# Patient Record
Sex: Female | Born: 1935
Health system: Southern US, Community
[De-identification: ages and names within clinical notes are randomized; demographics above are authoritative.]

## PROBLEM LIST (undated history)

## (undated) DIAGNOSIS — I10 Essential (primary) hypertension: Secondary | ICD-10-CM

## (undated) DIAGNOSIS — E7800 Pure hypercholesterolemia, unspecified: Secondary | ICD-10-CM

---

## 2017-06-05 ENCOUNTER — Other Ambulatory Visit: Payer: Self-pay

## 2017-06-05 ENCOUNTER — Encounter (HOSPITAL_COMMUNITY): Payer: Self-pay | Admitting: Emergency Medicine

## 2017-06-05 ENCOUNTER — Inpatient Hospital Stay (HOSPITAL_COMMUNITY)
Admission: EM | Admit: 2017-06-05 | Discharge: 2017-06-10 | DRG: 871 | Disposition: A | Discharge status: Hospice | Payer: Medicare HMO | Attending: Internal Medicine | Admitting: Internal Medicine

## 2017-06-05 ENCOUNTER — Emergency Department (HOSPITAL_COMMUNITY): Payer: Medicare HMO

## 2017-06-05 DIAGNOSIS — R6251 Failure to thrive (child): Secondary | ICD-10-CM

## 2017-06-05 DIAGNOSIS — G20 Parkinson's disease: Secondary | ICD-10-CM | POA: Diagnosis not present

## 2017-06-05 DIAGNOSIS — Z515 Encounter for palliative care: Secondary | ICD-10-CM | POA: Diagnosis not present

## 2017-06-05 DIAGNOSIS — J181 Lobar pneumonia, unspecified organism: Secondary | ICD-10-CM | POA: Diagnosis present

## 2017-06-05 DIAGNOSIS — H9190 Unspecified hearing loss, unspecified ear: Secondary | ICD-10-CM | POA: Diagnosis not present

## 2017-06-05 DIAGNOSIS — R0902 Hypoxemia: Secondary | ICD-10-CM | POA: Diagnosis not present

## 2017-06-05 DIAGNOSIS — Z6821 Body mass index (BMI) 21.0-21.9, adult: Secondary | ICD-10-CM

## 2017-06-05 DIAGNOSIS — A408 Other streptococcal sepsis: Secondary | ICD-10-CM | POA: Diagnosis not present

## 2017-06-05 DIAGNOSIS — R269 Unspecified abnormalities of gait and mobility: Secondary | ICD-10-CM | POA: Diagnosis present

## 2017-06-05 DIAGNOSIS — Z7189 Other specified counseling: Secondary | ICD-10-CM

## 2017-06-05 DIAGNOSIS — E785 Hyperlipidemia, unspecified: Secondary | ICD-10-CM | POA: Diagnosis not present

## 2017-06-05 DIAGNOSIS — I361 Nonrheumatic tricuspid (valve) insufficiency: Secondary | ICD-10-CM | POA: Diagnosis not present

## 2017-06-05 DIAGNOSIS — Z7984 Long term (current) use of oral hypoglycemic drugs: Secondary | ICD-10-CM | POA: Diagnosis not present

## 2017-06-05 DIAGNOSIS — I330 Acute and subacute infective endocarditis: Secondary | ICD-10-CM | POA: Diagnosis present

## 2017-06-05 DIAGNOSIS — Z9181 History of falling: Secondary | ICD-10-CM

## 2017-06-05 DIAGNOSIS — I482 Chronic atrial fibrillation: Secondary | ICD-10-CM | POA: Diagnosis present

## 2017-06-05 DIAGNOSIS — G9341 Metabolic encephalopathy: Secondary | ICD-10-CM | POA: Diagnosis not present

## 2017-06-05 DIAGNOSIS — E7800 Pure hypercholesterolemia, unspecified: Secondary | ICD-10-CM | POA: Diagnosis present

## 2017-06-05 DIAGNOSIS — J189 Pneumonia, unspecified organism: Secondary | ICD-10-CM

## 2017-06-05 DIAGNOSIS — Y831 Surgical operation with implant of artificial internal device as the cause of abnormal reaction of the patient, or of later complication, without mention of misadventure at the time of the procedure: Secondary | ICD-10-CM | POA: Diagnosis present

## 2017-06-05 DIAGNOSIS — Z781 Physical restraint status: Secondary | ICD-10-CM

## 2017-06-05 DIAGNOSIS — T826XXA Infection and inflammatory reaction due to cardiac valve prosthesis, initial encounter: Secondary | ICD-10-CM | POA: Diagnosis not present

## 2017-06-05 DIAGNOSIS — R7881 Bacteremia: Secondary | ICD-10-CM | POA: Diagnosis not present

## 2017-06-05 DIAGNOSIS — A419 Sepsis, unspecified organism: Secondary | ICD-10-CM

## 2017-06-05 DIAGNOSIS — Z66 Do not resuscitate: Secondary | ICD-10-CM | POA: Diagnosis not present

## 2017-06-05 DIAGNOSIS — F0151 Vascular dementia with behavioral disturbance: Secondary | ICD-10-CM | POA: Diagnosis present

## 2017-06-05 DIAGNOSIS — Z7901 Long term (current) use of anticoagulants: Secondary | ICD-10-CM | POA: Diagnosis not present

## 2017-06-05 DIAGNOSIS — R4182 Altered mental status, unspecified: Secondary | ICD-10-CM | POA: Diagnosis not present

## 2017-06-05 DIAGNOSIS — M21371 Foot drop, right foot: Secondary | ICD-10-CM

## 2017-06-05 DIAGNOSIS — F05 Delirium due to known physiological condition: Secondary | ICD-10-CM | POA: Diagnosis present

## 2017-06-05 DIAGNOSIS — Z974 Presence of external hearing-aid: Secondary | ICD-10-CM

## 2017-06-05 DIAGNOSIS — B955 Unspecified streptococcus as the cause of diseases classified elsewhere: Secondary | ICD-10-CM | POA: Diagnosis not present

## 2017-06-05 DIAGNOSIS — F02818 Dementia in other diseases classified elsewhere, unspecified severity, with other behavioral disturbance: Secondary | ICD-10-CM

## 2017-06-05 DIAGNOSIS — R627 Adult failure to thrive: Secondary | ICD-10-CM | POA: Diagnosis present

## 2017-06-05 DIAGNOSIS — I2690 Septic pulmonary embolism without acute cor pulmonale: Secondary | ICD-10-CM | POA: Diagnosis not present

## 2017-06-05 DIAGNOSIS — I10 Essential (primary) hypertension: Secondary | ICD-10-CM | POA: Diagnosis not present

## 2017-06-05 DIAGNOSIS — F0281 Dementia in other diseases classified elsewhere with behavioral disturbance: Secondary | ICD-10-CM

## 2017-06-05 DIAGNOSIS — I38 Endocarditis, valve unspecified: Secondary | ICD-10-CM

## 2017-06-05 DIAGNOSIS — I76 Septic arterial embolism: Secondary | ICD-10-CM | POA: Diagnosis present

## 2017-06-05 DIAGNOSIS — R262 Difficulty in walking, not elsewhere classified: Secondary | ICD-10-CM | POA: Diagnosis not present

## 2017-06-05 DIAGNOSIS — B370 Candidal stomatitis: Secondary | ICD-10-CM

## 2017-06-05 DIAGNOSIS — A409 Streptococcal sepsis, unspecified: Secondary | ICD-10-CM | POA: Diagnosis not present

## 2017-06-05 HISTORY — DX: Pure hypercholesterolemia, unspecified: E78.00

## 2017-06-05 HISTORY — DX: Essential (primary) hypertension: I10

## 2017-06-05 LAB — URINALYSIS, ROUTINE W REFLEX MICROSCOPIC
Bilirubin Urine: NEGATIVE
GLUCOSE, UA: NEGATIVE mg/dL
Ketones, ur: 20 mg/dL — AB
Leukocytes, UA: NEGATIVE
Nitrite: NEGATIVE
PROTEIN: 30 mg/dL — AB
SPECIFIC GRAVITY, URINE: 1.013 (ref 1.005–1.030)
pH: 7 (ref 5.0–8.0)

## 2017-06-05 LAB — BLOOD CULTURE ID PANEL (REFLEXED)
ACINETOBACTER BAUMANNII: NOT DETECTED
CANDIDA ALBICANS: NOT DETECTED
CANDIDA TROPICALIS: NOT DETECTED
Candida glabrata: NOT DETECTED
Candida krusei: NOT DETECTED
Candida parapsilosis: NOT DETECTED
ENTEROBACTERIACEAE SPECIES: NOT DETECTED
ENTEROCOCCUS SPECIES: NOT DETECTED
Enterobacter cloacae complex: NOT DETECTED
Escherichia coli: NOT DETECTED
HAEMOPHILUS INFLUENZAE: NOT DETECTED
KLEBSIELLA PNEUMONIAE: NOT DETECTED
Klebsiella oxytoca: NOT DETECTED
LISTERIA MONOCYTOGENES: NOT DETECTED
NEISSERIA MENINGITIDIS: NOT DETECTED
Proteus species: NOT DETECTED
Pseudomonas aeruginosa: NOT DETECTED
STAPHYLOCOCCUS AUREUS BCID: NOT DETECTED
STREPTOCOCCUS AGALACTIAE: NOT DETECTED
STREPTOCOCCUS SPECIES: DETECTED — AB
Serratia marcescens: NOT DETECTED
Staphylococcus species: NOT DETECTED
Streptococcus pneumoniae: NOT DETECTED
Streptococcus pyogenes: NOT DETECTED

## 2017-06-05 LAB — RESPIRATORY PANEL BY PCR
Adenovirus: NOT DETECTED
Bordetella pertussis: NOT DETECTED
CORONAVIRUS OC43-RVPPCR: NOT DETECTED
Chlamydophila pneumoniae: NOT DETECTED
Coronavirus 229E: NOT DETECTED
Coronavirus HKU1: NOT DETECTED
Coronavirus NL63: NOT DETECTED
INFLUENZA A-RVPPCR: NOT DETECTED
INFLUENZA B-RVPPCR: NOT DETECTED
METAPNEUMOVIRUS-RVPPCR: NOT DETECTED
Mycoplasma pneumoniae: NOT DETECTED
PARAINFLUENZA VIRUS 1-RVPPCR: NOT DETECTED
PARAINFLUENZA VIRUS 2-RVPPCR: NOT DETECTED
PARAINFLUENZA VIRUS 4-RVPPCR: NOT DETECTED
Parainfluenza Virus 3: NOT DETECTED
RESPIRATORY SYNCYTIAL VIRUS-RVPPCR: NOT DETECTED
Rhinovirus / Enterovirus: NOT DETECTED

## 2017-06-05 LAB — COMPREHENSIVE METABOLIC PANEL
ALBUMIN: 3.5 g/dL (ref 3.5–5.0)
ALT: 27 U/L (ref 14–54)
ANION GAP: 12 (ref 5–15)
AST: 37 U/L (ref 15–41)
Alkaline Phosphatase: 92 U/L (ref 38–126)
BUN: 14 mg/dL (ref 6–20)
CO2: 24 mmol/L (ref 22–32)
Calcium: 8.5 mg/dL — ABNORMAL LOW (ref 8.9–10.3)
Chloride: 96 mmol/L — ABNORMAL LOW (ref 101–111)
Creatinine, Ser: 0.9 mg/dL (ref 0.44–1.00)
GFR calc Af Amer: >60 mL/min (ref >60)
GFR calc non Af Amer: 53 mL/min — ABNORMAL LOW (ref >60)
GLUCOSE: 139 mg/dL — AB (ref 65–99)
POTASSIUM: 3.4 mmol/L — AB (ref 3.5–5.1)
SODIUM: 132 mmol/L — AB (ref 135–145)
Total Bilirubin: 1.8 mg/dL — ABNORMAL HIGH (ref 0.3–1.2)
Total Protein: 6.8 g/dL (ref 6.5–8.1)

## 2017-06-05 LAB — CBC WITH DIFFERENTIAL/PLATELET
Basophils Absolute: 0 10*3/uL (ref 0.0–0.1)
Basophils Relative: 0 %
EOS PCT: 0 %
Eosinophils Absolute: 0 10*3/uL (ref 0.0–0.7)
HEMATOCRIT: 39.8 % (ref 36.0–46.0)
Hemoglobin: 13.4 g/dL (ref 12.0–15.0)
LYMPHS ABS: 0.7 10*3/uL (ref 0.7–4.0)
LYMPHS PCT: 4 %
MCH: 31.7 pg (ref 26.0–34.0)
MCHC: 33.7 g/dL (ref 30.0–36.0)
MCV: 94.1 fL (ref 78.0–100.0)
MONO ABS: 1.5 10*3/uL — AB (ref 0.1–1.0)
MONOS PCT: 8 %
NEUTROS ABS: 15.4 10*3/uL — AB (ref 1.7–7.7)
Neutrophils Relative %: 88 %
Platelets: 158 10*3/uL (ref 150–400)
RBC: 4.23 MIL/uL (ref 3.87–5.11)
RDW: 13.6 % (ref 11.5–15.5)
WBC: 17.7 10*3/uL — ABNORMAL HIGH (ref 4.0–10.5)

## 2017-06-05 LAB — GLUCOSE, CAPILLARY
GLUCOSE-CAPILLARY: 155 mg/dL — AB (ref 65–99)
Glucose-Capillary: 121 mg/dL — ABNORMAL HIGH (ref 65–99)
Glucose-Capillary: 155 mg/dL — ABNORMAL HIGH (ref 65–99)

## 2017-06-05 LAB — CG4 I-STAT (LACTIC ACID): Lactic Acid, Venous: 1.89 mmol/L (ref 0.5–1.9)

## 2017-06-05 LAB — HEMOGLOBIN A1C
Hgb A1c MFr Bld: 6.2 % — ABNORMAL HIGH (ref 4.8–5.6)
Mean Plasma Glucose: 131.24 mg/dL

## 2017-06-05 LAB — INFLUENZA PANEL BY PCR (TYPE A & B)
Influenza A By PCR: NEGATIVE
Influenza B By PCR: NEGATIVE

## 2017-06-05 LAB — TROPONIN I: TROPONIN I: 0.5 ng/mL — AB (ref <0.03)

## 2017-06-05 LAB — STREP PNEUMONIAE URINARY ANTIGEN: Strep Pneumo Urinary Antigen: NEGATIVE

## 2017-06-05 MED ORDER — PIPERACILLIN-TAZOBACTAM 3.375 G IVPB
3.3750 g | Freq: Three times a day (TID) | INTRAVENOUS | Status: DC
  Administered 2017-06-05: 3.375 g via INTRAVENOUS
  Filled 2017-06-05 (×2): qty 50

## 2017-06-05 MED ORDER — HYDRALAZINE HCL 20 MG/ML IJ SOLN
5.0000 mg | INTRAMUSCULAR | Status: DC | PRN
Start: 2017-06-05 — End: 2017-06-08

## 2017-06-05 MED ORDER — BRIMONIDINE TARTRATE-TIMOLOL 0.2-0.5 % OP SOLN: 1.0000 [drp] | Freq: Two times a day (BID) | OPHTHALMIC | Status: DC

## 2017-06-05 MED ORDER — LATANOPROST 0.005 % OP SOLN
1.0000 [drp] | Freq: Every day | OPHTHALMIC | Status: DC
  Administered 2017-06-05 – 2017-06-10 (×6): 1 [drp] via OPHTHALMIC
  Filled 2017-06-05: qty 2.5

## 2017-06-05 MED ORDER — IPRATROPIUM-ALBUTEROL 0.5-2.5 (3) MG/3ML IN SOLN
3.0000 mL | Freq: Four times a day (QID) | RESPIRATORY_TRACT | Status: DC
  Administered 2017-06-05: 3 mL via RESPIRATORY_TRACT
  Filled 2017-06-05: qty 3

## 2017-06-05 MED ORDER — IPRATROPIUM-ALBUTEROL 0.5-2.5 (3) MG/3ML IN SOLN
3.0000 mL | Freq: Four times a day (QID) | RESPIRATORY_TRACT | Status: DC
  Filled 2017-06-05: qty 3

## 2017-06-05 MED ORDER — TIMOLOL MALEATE 0.5 % OP SOLN
1.0000 [drp] | Freq: Two times a day (BID) | OPHTHALMIC | Status: DC
  Administered 2017-06-05 – 2017-06-10 (×12): 1 [drp] via OPHTHALMIC
  Filled 2017-06-05: qty 5

## 2017-06-05 MED ORDER — ACETAMINOPHEN 650 MG RE SUPP: 650.0000 mg | Freq: Four times a day (QID) | RECTAL | Status: DC | PRN

## 2017-06-05 MED ORDER — DEXTROSE 5 % IV SOLN
500.0000 mg | INTRAVENOUS | Status: DC
  Administered 2017-06-05 – 2017-06-09 (×5): 500 mg via INTRAVENOUS
  Filled 2017-06-05 (×5): qty 500

## 2017-06-05 MED ORDER — ATORVASTATIN CALCIUM 40 MG PO TABS
40.0000 mg | ORAL_TABLET | Freq: Every day | ORAL | Status: DC
  Administered 2017-06-07 – 2017-06-08 (×2): 40 mg via ORAL
  Filled 2017-06-05 (×3): qty 1

## 2017-06-05 MED ORDER — METRONIDAZOLE IN NACL 5-0.79 MG/ML-% IV SOLN
500.0000 mg | Freq: Three times a day (TID) | INTRAVENOUS | Status: DC
  Administered 2017-06-05 – 2017-06-07 (×6): 500 mg via INTRAVENOUS
  Filled 2017-06-05 (×8): qty 100

## 2017-06-05 MED ORDER — BISACODYL 5 MG PO TBEC
5.0000 mg | DELAYED_RELEASE_TABLET | Freq: Every day | ORAL | Status: DC | PRN
  Administered 2017-06-07: 5 mg via ORAL
  Filled 2017-06-05: qty 1

## 2017-06-05 MED ORDER — VANCOMYCIN HCL IN DEXTROSE 1-5 GM/200ML-% IV SOLN
1000.0000 mg | Freq: Once | INTRAVENOUS | Status: AC
  Administered 2017-06-05: 1000 mg via INTRAVENOUS
  Filled 2017-06-05: qty 200

## 2017-06-05 MED ORDER — HYDROCODONE-ACETAMINOPHEN 5-325 MG PO TABS: 1.0000 | ORAL_TABLET | ORAL | Status: DC | PRN

## 2017-06-05 MED ORDER — DIGOXIN 125 MCG PO TABS
0.1250 mg | ORAL_TABLET | Freq: Every day | ORAL | Status: DC
  Administered 2017-06-05 – 2017-06-10 (×6): 0.125 mg via ORAL
  Filled 2017-06-05 (×6): qty 1

## 2017-06-05 MED ORDER — IPRATROPIUM-ALBUTEROL 0.5-2.5 (3) MG/3ML IN SOLN: 3.0000 mL | RESPIRATORY_TRACT | Status: DC | PRN

## 2017-06-05 MED ORDER — HALOPERIDOL LACTATE 5 MG/ML IJ SOLN
1.0000 mg | Freq: Four times a day (QID) | INTRAMUSCULAR | Status: DC | PRN
  Administered 2017-06-07: 1 mg via INTRAVENOUS
  Filled 2017-06-05 (×2): qty 1

## 2017-06-05 MED ORDER — ALBUTEROL SULFATE (2.5 MG/3ML) 0.083% IN NEBU: 2.5000 mg | INHALATION_SOLUTION | RESPIRATORY_TRACT | Status: DC | PRN

## 2017-06-05 MED ORDER — VANCOMYCIN HCL IN DEXTROSE 1-5 GM/200ML-% IV SOLN: 1000.0000 mg | INTRAVENOUS | Status: DC

## 2017-06-05 MED ORDER — ACETAMINOPHEN 325 MG PO TABS: 650.0000 mg | ORAL_TABLET | Freq: Four times a day (QID) | ORAL | Status: DC | PRN

## 2017-06-05 MED ORDER — INSULIN ASPART 100 UNIT/ML ~~LOC~~ SOLN
0.0000 [IU] | Freq: Three times a day (TID) | SUBCUTANEOUS | Status: DC
Start: 2017-06-05 — End: 2017-06-10
  Administered 2017-06-05: 1 [IU] via SUBCUTANEOUS
  Administered 2017-06-05: 2 [IU] via SUBCUTANEOUS
  Administered 2017-06-08 (×2): 1 [IU] via SUBCUTANEOUS
  Administered 2017-06-09: 2 [IU] via SUBCUTANEOUS
  Administered 2017-06-09 – 2017-06-10 (×2): 1 [IU] via SUBCUTANEOUS

## 2017-06-05 MED ORDER — DOCUSATE SODIUM 100 MG PO CAPS
100.0000 mg | ORAL_CAPSULE | Freq: Two times a day (BID) | ORAL | Status: DC
  Administered 2017-06-06 – 2017-06-09 (×5): 100 mg via ORAL
  Filled 2017-06-05 (×8): qty 1

## 2017-06-05 MED ORDER — PIPERACILLIN-TAZOBACTAM 3.375 G IVPB
3.3750 g | Freq: Once | INTRAVENOUS | Status: AC
  Administered 2017-06-05: 3.375 g via INTRAVENOUS
  Filled 2017-06-05: qty 50

## 2017-06-05 MED ORDER — ORAL CARE MOUTH RINSE
15.0000 mL | Freq: Two times a day (BID) | OROMUCOSAL | Status: DC
  Administered 2017-06-06 – 2017-06-10 (×6): 15 mL via OROMUCOSAL

## 2017-06-05 MED ORDER — SODIUM CHLORIDE 0.9 % IV BOLUS (SEPSIS)
1000.0000 mL | Freq: Once | INTRAVENOUS | Status: AC
  Administered 2017-06-05: 1000 mL via INTRAVENOUS

## 2017-06-05 MED ORDER — BRIMONIDINE TARTRATE 0.2 % OP SOLN
1.0000 [drp] | Freq: Two times a day (BID) | OPHTHALMIC | Status: DC
  Administered 2017-06-05 – 2017-06-10 (×12): 1 [drp] via OPHTHALMIC
  Filled 2017-06-05: qty 5

## 2017-06-05 MED ORDER — APIXABAN 2.5 MG PO TABS
2.5000 mg | ORAL_TABLET | Freq: Two times a day (BID) | ORAL | Status: DC
  Administered 2017-06-05 – 2017-06-10 (×11): 2.5 mg via ORAL
  Filled 2017-06-05 (×11): qty 1

## 2017-06-05 MED ORDER — POTASSIUM CHLORIDE CRYS ER 20 MEQ PO TBCR
20.0000 meq | EXTENDED_RELEASE_TABLET | Freq: Once | ORAL | Status: AC
  Administered 2017-06-05: 20 meq via ORAL
  Filled 2017-06-05: qty 1

## 2017-06-05 MED ORDER — LISINOPRIL 20 MG PO TABS
20.0000 mg | ORAL_TABLET | Freq: Every day | ORAL | Status: DC
  Administered 2017-06-05 – 2017-06-10 (×6): 20 mg via ORAL
  Filled 2017-06-05 (×6): qty 1

## 2017-06-05 MED ORDER — ONDANSETRON HCL 4 MG/2ML IJ SOLN: 4.0000 mg | Freq: Three times a day (TID) | INTRAMUSCULAR | Status: DC | PRN

## 2017-06-05 MED ORDER — DEXTROSE 5 % IV SOLN
1.0000 g | INTRAVENOUS | Status: DC
  Administered 2017-06-05: 1 g via INTRAVENOUS
  Filled 2017-06-05: qty 10

## 2017-06-05 MED ORDER — SODIUM CHLORIDE 0.9 % IV SOLN
INTRAVENOUS | Status: DC
  Administered 2017-06-05: 50 mL/h via INTRAVENOUS
  Administered 2017-06-08: 21:00:00 via INTRAVENOUS

## 2017-06-05 MED ORDER — METFORMIN HCL ER 500 MG PO TB24
500.0000 mg | ORAL_TABLET | Freq: Every day | ORAL | Status: DC
  Filled 2017-06-05: qty 1

## 2017-06-05 MED ORDER — FUROSEMIDE 10 MG/ML IJ SOLN
40.0000 mg | Freq: Once | INTRAMUSCULAR | Status: AC
  Administered 2017-06-05: 40 mg via INTRAVENOUS
  Filled 2017-06-05: qty 4

## 2017-06-05 MED ORDER — SODIUM CHLORIDE 0.9 % IV SOLN
INTRAVENOUS | Status: DC
Start: 2017-06-05 — End: 2017-06-05
  Administered 2017-06-05: 06:00:00 via INTRAVENOUS

## 2017-06-05 NOTE — H&P (Signed)
History and Physical    Samantha Silva YQM:[id number] DOB: 07/05/1922 DOA: 06/05/2017  PCP: Patient, No Pcp Per Patient coming from: Home   Chief Complaint: confusion and weakness  HPI: Samantha Silva is a 83 y.o. female with medical history significant of open heart surgery, atrial fibrillation, hypertension and hypercholesteremia. Pt recently moved to [place] from [place] to live with Family here.  Pt has had increasing dementia and requires a lot of care from family.  Pt has recently developed increased confusion since the move.  Her daughter inlaw has been sleeping in the room with her.  She reports increased confusion,  Decreased ability to walk.  Pt has developed a cough and had increased shortness of breath during the night     ED Course: EMS found pt to have a temp of 103 at home.  Her 02 saturation was 87%.  Pt placed on 02 and given antibiotics  Review of Systems  Constitutional: Positive for chills and fever.  HENT: Positive for congestion and hearing loss.   Eyes: Negative for blurred vision.  Respiratory: Positive for cough.   Cardiovascular: Negative for chest pain and palpitations.  Gastrointestinal: Negative for abdominal pain.  Genitourinary: Negative for dysuria.  Musculoskeletal: Negative for myalgias.  Skin: Negative for itching and rash.  Neurological: Positive for weakness. Negative for dizziness and focal weakness.  Endo/Heme/Allergies: Does not bruise/bleed easily.  Psychiatric/Behavioral: Negative for depression.  All other systems reviewed and are negative.   Ambulatory Status: ambulatory with a walker  Past Medical History:  Diagnosis Date  . High cholesterol   . Hypertension     History reviewed. No pertinent surgical history.  Social History   Socioeconomic History  . Marital status: Widowed    Spouse name: Not on file  . Number of children: Not on file  . Years of education: Not on file  . Highest education level: Not on file  Social  Needs  . Financial resource strain: Not on file  . Food insecurity - worry: Not on file  . Food insecurity - inability: Not on file  . Transportation needs - medical: Not on file  . Transportation needs - non-medical: Not on file  Occupational History  . Not on file  Tobacco Use  . Smoking status: Never Smoker  . Smokeless tobacco: Never Used  Substance and Sexual Activity  . Alcohol use: Not on file  . Drug use: Not on file  . Sexual activity: Not on file  Other Topics Concern  . Not on file  Social History Narrative  . Not on file    No Known Allergies  History reviewed. No pertinent family history.  Prior to Admission medications   Medication Sig Start Date End Date Taking? Authorizing Provider  apixaban (ELIQUIS) 2.5 MG TABS tablet Take 2.5 mg by mouth 2 (two) times daily.   Yes [provider]  atorvastatin (LIPITOR) 40 MG tablet Take 40 mg by mouth daily.   Yes [provider]  bisacodyl (DULCOLAX) 5 MG EC tablet Take 5 mg by mouth daily as needed for moderate constipation.   Yes [provider]  brimonidine-timolol (COMBIGAN) 0.2-0.5 % ophthalmic solution Place 1 drop into both eyes every 12 (twelve) hours.   Yes [provider]  digoxin (LANOXIN) 0.125 MG tablet Take 0.125 mg by mouth daily.   Yes [provider]  latanoprost (XALATAN) 0.005 % ophthalmic solution Place 1 drop into both eyes at bedtime.   Yes [provider]  lisinopril (  PRINIVIL,ZESTRIL) 20 MG tablet Take 20 mg by mouth daily.   Yes [provider]  metFORMIN (GLUCOPHAGE-XR) 500 MG 24 hr tablet Take 500 mg by mouth daily with breakfast.   Yes [provider]    Physical Exam: Vitals:   06/05/17 0430 06/05/17 0445 06/05/17 0523 06/05/17 0804  BP: 124/84 125/69 (!) 157/82 (!) 183/81  Pulse: 83 81 85 84  Resp: 16 15 (!) 24 (!) 23  Temp:   97.7 F (36.5 C) 99.7 F (37.6 C)  TempSrc:   Oral Oral  SpO2: 94% 95% 96% 92%  Weight:    62 kg (136 lb 11 oz)   Height:   5\' 7"  (1.702 m)      General:  Appears calm and comfortable Eyes:  PERRL, EOMI, normal lids, iris ENT:  grossly hard of hearing , lips & tongue, mmm Neck:  no LAD, masses or thyromegaly Cardiovascular:  RRR, no m/r/g. No LE edema.  Respiratory:  Rhonchi , no wheezing. Normal respiratory effort. Abdomen:  soft, ntnd, NABS Skin:  no rash or induration seen on limited exam Musculoskeletal:  grossly normal tone BUE/BLE, good ROM, no bony abnormality Psychiatric:  grossly normal mood and affect, speech fluent and appropriate, AOx3 Neurologic:  CN 2-12 grossly intact, moves all extremities in coordinated fashion, sensation intact  Labs on Admission: I have personally reviewed following labs and imaging studies  CBC: Recent Labs  Lab 06/05/17 0053  WBC 17.7*  NEUTROABS 15.4*  HGB 13.4  HCT 39.8  MCV 94.1  PLT 158   Basic Metabolic Panel: Recent Labs  Lab 06/05/17 0053  NA 132*  K 3.4*  CL 96*  CO2 24  GLUCOSE 139*  BUN 14  CREATININE 0.90  CALCIUM 8.5*   GFR: Estimated Creatinine Clearance: 38.8 mL/min (by C-G formula based on SCr of 0.9 mg/dL). Liver Function Tests: Recent Labs  Lab 06/05/17 0053  AST 37  ALT 27  ALKPHOS 92  BILITOT 1.8*  PROT 6.8  ALBUMIN 3.5   No results for input(s): LIPASE, AMYLASE in the last 168 hours. No results for input(s): AMMONIA in the last 168 hours. Coagulation Profile: No results for input(s): INR, PROTIME in the last 168 hours. Cardiac Enzymes: No results for input(s): CKTOTAL, CKMB, CKMBINDEX, TROPONINI in the last 168 hours. BNP (last 3 results) No results for input(s): PROBNP in the last 8760 hours. HbA1C: No results for input(s): HGBA1C in the last 72 hours. CBG: No results for input(s): GLUCAP in the last 168 hours. Lipid Profile: No results for input(s): CHOL, HDL, LDLCALC, TRIG, CHOLHDL, LDLDIRECT in the last 72 hours. Thyroid Function Tests: No results for input(s): TSH, T4TOTAL,  FREET4, T3FREE, THYROIDAB in the last 72 hours. Anemia Panel: No results for input(s): VITAMINB12, FOLATE, FERRITIN, TIBC, IRON, RETICCTPCT in the last 72 hours. Urine analysis:    Component Value Date/Time   COLORURINE YELLOW 06/05/2017 0047   APPEARANCEUR CLEAR 06/05/2017 0047   LABSPEC 1.013 06/05/2017 0047   PHURINE 7.0 06/05/2017 0047   GLUCOSEU NEGATIVE 06/05/2017 0047   HGBUR SMALL (A) 06/05/2017 0047   BILIRUBINUR NEGATIVE 06/05/2017 0047   KETONESUR 20 (A) 06/05/2017 0047   PROTEINUR 30 (A) 06/05/2017 0047   NITRITE NEGATIVE 06/05/2017 0047   LEUKOCYTESUR NEGATIVE 06/05/2017 0047    Creatinine Clearance: Estimated Creatinine Clearance: 38.8 mL/min (by C-G formula based on SCr of 0.9 mg/dL).  Sepsis Labs: @LABRCNTIP (procalcitonin:4,lacticidven:4) ) Recent Results (from the past 240 hour(s))  Respiratory Panel by PCR  Status: None   Collection Time: 06/05/17  3:42 AM  Result Value Ref Range Status   Adenovirus NOT DETECTED NOT DETECTED Final   Coronavirus 229E NOT DETECTED NOT DETECTED Final   Coronavirus HKU1 NOT DETECTED NOT DETECTED Final   Coronavirus NL63 NOT DETECTED NOT DETECTED Final   Coronavirus OC43 NOT DETECTED NOT DETECTED Final   Metapneumovirus NOT DETECTED NOT DETECTED Final   Rhinovirus / Enterovirus NOT DETECTED NOT DETECTED Final   Influenza A NOT DETECTED NOT DETECTED Final   Influenza B NOT DETECTED NOT DETECTED Final   Parainfluenza Virus 1 NOT DETECTED NOT DETECTED Final   Parainfluenza Virus 2 NOT DETECTED NOT DETECTED Final   Parainfluenza Virus 3 NOT DETECTED NOT DETECTED Final   Parainfluenza Virus 4 NOT DETECTED NOT DETECTED Final   Respiratory Syncytial Virus NOT DETECTED NOT DETECTED Final   Bordetella pertussis NOT DETECTED NOT DETECTED Final   Chlamydophila pneumoniae NOT DETECTED NOT DETECTED Final   Mycoplasma pneumoniae NOT DETECTED NOT DETECTED Final     Radiological Exams on Admission: Dg Chest Port 1 View  Result Date:  06/05/2017 CLINICAL DATA:  Acute onset of fever.  Altered mental status. EXAM: PORTABLE CHEST 1 VIEW COMPARISON:  None. FINDINGS: The lungs are well-aerated. Diffuse right-sided and central left-sided airspace opacification may reflect pneumonia or asymmetric interstitial edema. A small left pleural effusion is suspected. No pneumothorax is seen. The cardiomediastinal silhouette is borderline normal in size. The patient is status post median sternotomy. No acute osseous abnormalities are seen. IMPRESSION: Diffuse right-sided and central left-sided airspace opacification may reflect pneumonia or asymmetric interstitial edema. Suspect small left pleural effusion. Electronically Signed   By: Roanna Raider M.D.   On: 06/05/2017 01:09    EKG: Independently reviewed.   Assessment/Plan Active Problems:   Lobar pneumonia (HCC)   Sepsis (HCC)  --IV antibiotics Vancomycin and Zosyn  Atrial Fibrillation --continue eliquis --digoxin  Hypertension --continue lisinopril --Continue hydralazine  Hypercholesterolemia --continue Lipitor         DVT prophylaxis: Pt is on eliquis Code Status: Full Family Communication: Dugher in Law Samantha Silva [phone]  Disposition Plan: Admission  Consults called: none Admission status: Inpatient   Langston Masker PA-C [facility]  If 7PM-7AM, please contact night-coverage [email or web address] Password [email or web address]  06/05/2017, 10:01 AM

## 2017-06-05 NOTE — Care Management (Signed)
This is a no charge note  Pending admission per Dr. Judd Lienelo  83 year old lady with past medical history of hypertension, hyperlipidemia, atrial fibrillation, CAD, CABG, who presents with altered mental status, shortness breath, found to have oxygen desaturation to 75-80 percent on room air. Chest x-ray showed bilateral infiltration.   Patient was found to have WBC 17.3, negative urinalysis, potassium 3.4, creatinine normal, temperature 103.6, tachycardia, tachypnea. The patient is admitted to stepdown as inpatient. IV vancomycin and Zosyn was started in ED.  Samantha HarpXilin Furqan Gosselin, MD  [facility] Pager [phone]  If 7PM-7AM, please contact night-coverage [email or web address] Password [email or web address] 06/05/2017, 3:43 AM

## 2017-06-05 NOTE — Progress Notes (Signed)
Patient arrived to [id number] via stretcher from ED.  Patient transferred to SD bed without incident.  Heart monitor placed and CCMD notified.  Patient's daughter in law present at bedside. Call bell placed within reach. Will continue to monitor.

## 2017-06-05 NOTE — Progress Notes (Signed)
Patient is very combative at this time & refusing ABG & to wear her oxygen. RN is aware & Dr. Margo AyeHall has been paged.

## 2017-06-05 NOTE — Progress Notes (Signed)
Patient agitated and combative at this time. Margo AyeHall, MD ordered ABG due to patient condition. Respiratory therapy attempted to get ABG. Patient attempting to hit and kick RT. Patient continuously pulling out oxygen. O2 sats in the low 80s. RN attempted to place oxygen back in patient's nose, patient kicking at staff and attempted to bite NT. Patient gripping RNs arm and yelling at staff, "get your dirty, stinky hands off me!" RN attempted to call family, no answer at this time. Margo AyeHall, MD paged. RN will continue to monitor.   On second attempt, patient's on answered the phone. Family unable to be at bedside at this time.

## 2017-06-05 NOTE — ED Provider Notes (Signed)
[facility] Provider Note   CSN: [id number] Arrival date & time:        History   Chief Complaint Chief Complaint  Patient presents with  . Altered Mental Status    HPI Shanna CiscoDolores Heeg is a 83 y.o. female.  Patient is a 83 year old female with past medical history of prior open heart surgery, atrial fibrillation.  She is brought here for evaluation of confusion which has apparently been worsening for the past 2 months.  She developed difficulty breathing this evening and family members called EMS.  The patient adds little to history secondary to confusion/acuity of condition.  From what the EMS tells me, the patient is here from [place].  The family has noticed a decline in her overall function over the past several months and she was brought here for family to look after her.  They have been unable to obtain a primary doctor for her.  Over the past several days she has been more confused and began having difficulty breathing this evening.  She was found by EMS to have a temperature of 103.3 and oxygen saturations of 87%.   The history is provided by the patient.  Altered Mental Status   This is a new problem. Associated symptoms include confusion and agitation.    No past medical history on file.  There are no active problems to display for this patient.     OB History    No data available       Home Medications    Prior to Admission medications   Not on File    Family History No family history on file.  Social History Social History   Tobacco Use  . Smoking status: Not on file  Substance Use Topics  . Alcohol use: Not on file  . Drug use: Not on file     Allergies   Patient has no allergy information on record.   Review of Systems Review of Systems  Unable to perform ROS: Acuity of condition  Psychiatric/Behavioral: Positive for agitation and confusion.     Physical Exam Updated Vital Signs SpO2 (!)  75%   Physical Exam  Constitutional: She is oriented to person, place, and time. No distress.  Patient is an elderly female.  She is confused, agitated, and uncooperative.  HENT:  Head: Normocephalic and atraumatic.  Mucous membranes appear somewhat dry  Eyes: EOM are normal. Pupils are equal, round, and reactive to light.  Neck: Normal range of motion. Neck supple.  Cardiovascular: Regular rhythm. Exam reveals no gallop and no friction rub.  No murmur heard. Heart is tachycardic, but regular.  Pulmonary/Chest: Effort normal and breath sounds normal. No respiratory distress. She has no wheezes.  Abdominal: Soft. Bowel sounds are normal. She exhibits no distension. There is no tenderness.  Musculoskeletal: Normal range of motion. She exhibits no edema or tenderness.  Lymphadenopathy:    She has no cervical adenopathy.  Neurological: She is alert and oriented to person, place, and time.  Skin: Skin is warm and dry. She is not diaphoretic.  Nursing note and vitals reviewed.    ED Treatments / Results  Labs (all labs ordered are listed, but only abnormal results are displayed) Labs Reviewed - No data to display  EKG  EKG Interpretation None       Radiology No results found.  Procedures Procedures (including critical care time)  Medications Ordered in ED Medications  sodium chloride 0.9 % bolus 1,000 mL (not  administered)    And  sodium chloride 0.9 % bolus 1,000 mL (not administered)     Initial Impression / Assessment and Plan / ED Course  I have reviewed the triage vital signs and the nursing notes.  Pertinent labs & imaging results that were available during my care of the patient were reviewed by me and considered in my medical decision making (see chart for details).  Patient arrives here febrile with an elevated white count and evidence for pneumonia on her chest x-ray.  Due to her fever, confusion, and vital signs, a code sepsis was called.  She was given  Vanco and Zosyn and will be admitted to the hospitalist service for further workup.  CRITICAL CARE Performed by: Geoffery Lyonsouglas Kerney Hopfensperger Total critical care time: 35 minutes Critical care time was exclusive of separately billable procedures and treating other patients. Critical care was necessary to treat or prevent imminent or life-threatening deterioration. Critical care was time spent personally by me on the following activities: development of treatment plan with patient and/or surrogate as well as nursing, discussions with consultants, evaluation of patient's response to treatment, examination of patient, obtaining history from patient or surrogate, ordering and performing treatments and interventions, ordering and review of laboratory studies, ordering and review of radiographic studies, pulse oximetry and re-evaluation of patient's condition.   Final Clinical Impressions(s) / ED Diagnoses   Final diagnoses:  None    ED Discharge Orders    None       Geoffery Lyonselo, Mariano Doshi, MD 06/06/17 [phone]

## 2017-06-05 NOTE — Progress Notes (Signed)
This is an addendum of H&P dictated by Advanced Practitioner Cheron SchaumannSofia Leslie PA-C:  Patient seen and examined with daughter in law at bedside. From home. [place]ived in [place]. No hospitalization in past year. CAP, poa; Acute hypoxic respiratory failure 2/2 to CAP, poa.  Agree with assessment and plan done by AP.

## 2017-06-05 NOTE — ED Triage Notes (Signed)
Patient presents from home. Per EMS patient lives at home with son. EMS reports patient is alerted and oriented to self only at baseline and yesterday she became increasing confused. EMS states family complained of abnormal breathe sounds and temp of 103. EMS states during transport she was increasingly combative. EMS reports patient was 75-80% on RA and increased to 99 on NRB.

## 2017-06-05 NOTE — ED Notes (Signed)
RN attempted to call report to floor; RN to call back  

## 2017-06-05 NOTE — Progress Notes (Signed)
CRITICAL VALUE ALERT  Critical Value: Trop 0.50  Date & Time Notied:  06/05/2017 2200  Provider Notified: Bruna PotterBlount   Orders Received/Actions taken: Waiting for new orders

## 2017-06-05 NOTE — Progress Notes (Signed)
Pharmacy Antibiotic Note  Shanna CiscoDolores Longbottom is a 83 y.o. female admitted on 06/05/2017 with pneumonia.  Pharmacy has been consulted for Rocephin dosing.  Cxr with pneumonia, Tm 102, WBC elevated at 17, Cr 0.9 K low 3.4.   Originally ordered vancomycin and zosyn - doses given earlier in ED - plan to change to azithromycin and rocephin for CAP  Plan: MD started azithromycin 500mg  IV q24h Rocephin 1gm IV q24h  KCL 20meq x1   Height: 5\' 7"  (170.2 cm) Weight: 136 lb 11 oz (62 kg) IBW/kg (Calculated) : 61.6  Temp (24hrs), Avg:99.8 F (37.7 C), Min:97.7 F (36.5 C), Max:103.6 F (39.8 C)  Recent Labs  Lab 06/05/17 0053 06/05/17 0105  WBC 17.7*  --   CREATININE 0.90  --   LATICACIDVEN  --  1.89    Estimated Creatinine Clearance: 38.8 mL/min (by C-G formula based on SCr of 0.9 mg/dL).    No Known Allergies  Antimicrobials this admission: vanc x1 Zosyn x1  Dose adjustments this admission:   Microbiology results: Viral panel neg Cx ip   Leota SauersLisa Caryle Helgeson Pharm.D. CPP, BCPS Clinical Pharmacist [phone] 06/05/2017 11:59 AM

## 2017-06-05 NOTE — Progress Notes (Signed)
PHARMACY - PHYSICIAN COMMUNICATION CRITICAL VALUE ALERT - BLOOD CULTURE IDENTIFICATION (BCID)  Samantha Silva is an 83 y.o. female who [facility] with a chief complaint of confusion and weakness.  Assessment:  Patient admitted complaining of increased confusion, decreased ability to walk, cough, and increased shortness of breath. Was started on vancomycin and zosyn but transition to azithromycin and ceftriaxone for pneumonia coverage. WBC was elevated at 17.7. Strep pneumo urine was negative. Blood cx in 3/4 bottles growing GPC in chains with BCID identifying streptococcus species.   Name of physician (or Provider) Contacted: Dr. Margo AyeHall   Current antibiotics: Ceftriaxone and azithromycin  Changes to prescribed antibiotics recommended:  Patient is on recommended antibiotics - No changes needed  Results for orders placed or performed during the hospital encounter of 06/05/17  Blood Culture ID Panel (Reflexed) (Collected: 06/05/2017 12:47 AM)  Result Value Ref Range   Enterococcus species NOT DETECTED NOT DETECTED   Listeria monocytogenes NOT DETECTED NOT DETECTED   Staphylococcus species NOT DETECTED NOT DETECTED   Staphylococcus aureus NOT DETECTED NOT DETECTED   Streptococcus species DETECTED (A) NOT DETECTED   Streptococcus agalactiae NOT DETECTED NOT DETECTED   Streptococcus pneumoniae NOT DETECTED NOT DETECTED   Streptococcus pyogenes NOT DETECTED NOT DETECTED   Acinetobacter baumannii NOT DETECTED NOT DETECTED   Enterobacteriaceae species NOT DETECTED NOT DETECTED   Enterobacter cloacae complex NOT DETECTED NOT DETECTED   Escherichia coli NOT DETECTED NOT DETECTED   Klebsiella oxytoca NOT DETECTED NOT DETECTED   Klebsiella pneumoniae NOT DETECTED NOT DETECTED   Proteus species NOT DETECTED NOT DETECTED   Serratia marcescens NOT DETECTED NOT DETECTED   Haemophilus influenzae NOT DETECTED NOT DETECTED   Neisseria meningitidis NOT DETECTED NOT DETECTED   Pseudomonas aeruginosa NOT DETECTED NOT DETECTED   Candida albicans NOT DETECTED NOT DETECTED   Candida glabrata NOT DETECTED NOT DETECTED   Candida krusei NOT DETECTED NOT DETECTED   Candida parapsilosis NOT DETECTED NOT DETECTED   Candida tropicalis NOT DETECTED NOT DETECTED    Samantha Silva, PharmD Clinical Pharmacist  Pager: [phone] Phone: [phone] 06/05/2017  6:16 PM

## 2017-06-05 NOTE — Progress Notes (Signed)
3/4 blood cultures positive for strep. Suspect aspiration PNA. NPO. Added IV flagyl to IV ceftriaxone and IV azithromycin. Speech therapist to evaluate in am. Repeat blood cultures in the am. Closely monitor VS.

## 2017-06-05 NOTE — Progress Notes (Signed)
Dr. Margo AyeHall paged x 2. No return calls back at this time.

## 2017-06-05 NOTE — ED Notes (Signed)
ED Provider at bedside. 

## 2017-06-06 ENCOUNTER — Inpatient Hospital Stay (HOSPITAL_COMMUNITY): Payer: Medicare HMO

## 2017-06-06 LAB — GLUCOSE, CAPILLARY: Glucose-Capillary: 98 mg/dL (ref 65–99)

## 2017-06-06 LAB — HIV ANTIBODY (ROUTINE TESTING W REFLEX): HIV SCREEN 4TH GENERATION: NONREACTIVE

## 2017-06-06 LAB — CBC
HEMATOCRIT: 32 % — AB (ref 36.0–46.0)
Hemoglobin: 10.7 g/dL — ABNORMAL LOW (ref 12.0–15.0)
MCH: 30.7 pg (ref 26.0–34.0)
MCHC: 33.4 g/dL (ref 30.0–36.0)
MCV: 92 fL (ref 78.0–100.0)
Platelets: 111 10*3/uL — ABNORMAL LOW (ref 150–400)
RBC: 3.48 MIL/uL — ABNORMAL LOW (ref 3.87–5.11)
RDW: 13.1 % (ref 11.5–15.5)
WBC: 13.6 10*3/uL — ABNORMAL HIGH (ref 4.0–10.5)

## 2017-06-06 LAB — COMPREHENSIVE METABOLIC PANEL
ALT: 27 U/L (ref 14–54)
ANION GAP: 12 (ref 5–15)
AST: 59 U/L — ABNORMAL HIGH (ref 15–41)
Albumin: 2.7 g/dL — ABNORMAL LOW (ref 3.5–5.0)
Alkaline Phosphatase: 73 U/L (ref 38–126)
BUN: 10 mg/dL (ref 6–20)
CALCIUM: 7.9 mg/dL — AB (ref 8.9–10.3)
CHLORIDE: 99 mmol/L — AB (ref 101–111)
CO2: 24 mmol/L (ref 22–32)
Creatinine, Ser: 0.68 mg/dL (ref 0.44–1.00)
Glucose, Bld: 127 mg/dL — ABNORMAL HIGH (ref 65–99)
Potassium: 2.7 mmol/L — CL (ref 3.5–5.1)
SODIUM: 135 mmol/L (ref 135–145)
TOTAL PROTEIN: 6.3 g/dL — AB (ref 6.5–8.1)
Total Bilirubin: 1.7 mg/dL — ABNORMAL HIGH (ref 0.3–1.2)

## 2017-06-06 LAB — LEGIONELLA PNEUMOPHILA SEROGP 1 UR AG: L. pneumophila Serogp 1 Ur Ag: NEGATIVE

## 2017-06-06 LAB — LACTIC ACID, PLASMA: Lactic Acid, Venous: 0.9 mmol/L (ref 0.5–1.9)

## 2017-06-06 LAB — PROCALCITONIN: PROCALCITONIN: 0.5 ng/mL

## 2017-06-06 MED ORDER — POTASSIUM CHLORIDE 10 MEQ/100ML IV SOLN
10.0000 meq | INTRAVENOUS | Status: AC
  Administered 2017-06-06 (×4): 10 meq via INTRAVENOUS
  Filled 2017-06-06: qty 100

## 2017-06-06 MED ORDER — QUETIAPINE FUMARATE 50 MG PO TABS
25.0000 mg | ORAL_TABLET | Freq: Every day | ORAL | Status: DC
  Administered 2017-06-06 – 2017-06-09 (×4): 25 mg via ORAL
  Filled 2017-06-06 (×4): qty 1

## 2017-06-06 MED ORDER — DEXTROSE 5 % IV SOLN
2.0000 g | Freq: Two times a day (BID) | INTRAVENOUS | Status: DC
  Administered 2017-06-06 – 2017-06-09 (×7): 2 g via INTRAVENOUS
  Filled 2017-06-06 (×8): qty 2

## 2017-06-06 MED ORDER — VANCOMYCIN HCL 10 G IV SOLR
1500.0000 mg | Freq: Once | INTRAVENOUS | Status: AC
  Administered 2017-06-06: 1500 mg via INTRAVENOUS
  Filled 2017-06-06: qty 1500

## 2017-06-06 MED ORDER — VANCOMYCIN HCL IN DEXTROSE 1-5 GM/200ML-% IV SOLN
1000.0000 mg | INTRAVENOUS | Status: DC
  Administered 2017-06-07: 1000 mg via INTRAVENOUS
  Filled 2017-06-06 (×2): qty 200

## 2017-06-06 MED ORDER — IPRATROPIUM-ALBUTEROL 0.5-2.5 (3) MG/3ML IN SOLN: 3.0000 mL | Freq: Four times a day (QID) | RESPIRATORY_TRACT | Status: DC | PRN

## 2017-06-06 NOTE — Progress Notes (Signed)
PROGRESS NOTE    Samantha Silva  ONG:[id number] DOB: 1922/09/02 DOA: 06/05/2017 PCP: Patient, No Pcp Per    Brief Narrative:  Please see admit H and P for details. Briefly, 83 y.o. female with medical history significant of open heart surgery, atrial fibrillation, hypertension and hypercholesteremia. Pt recently moved to [place] from [place] to live with Family here.  Pt has had increasing dementia and requires a lot of care from family.  Pt has recently developed increased confusion since the move.  Her daughter inlaw has been sleeping in the room with her.  She reported increased confusion, Decreased ability to walk.  Pt has developed a cough and had increased shortness of breath   Assessment & Plan:   Active Problems:   Lobar pneumonia (HCC)   Sepsis (HCC)  Active Problems:   Lobar pneumonia with Sepsis present on admission  -CXR reviewed. Patient with R and central L sided airspace consolidation noted -Patient started on azithromycin and rocephin with flagyl added overnight -Currently on 2L [place] -Continue to wean O2 as tolerated  Atrial Fibrillation --continue eliquis. No evidence of acute blood loss --digoxin -Presently rate controlled. Continue to monitor  Hypertension --continue lisinopril --Continue hydralazine as tolerated - continue to monitor  Hypercholesterolemia --continue Lipitor as tolerated  Likely Hx of Dementia -Family at bedside who describes long history of progressive decline and memory loss -suspect element of dementia -Patient noted to have increased agitation while in hospital. Will give trial of Seroquel at night -Continue Haldol PRN   DVT prophylaxis: Eliquis Code Status: Full Family Communication: Pt in room, family at bedside Disposition Plan: Uncertain at this time  Consultants:     Procedures:     Antimicrobials: Anti-infectives (From admission, onward)   Start     Dose/Rate Route Frequency Ordered Stop   06/07/17 0800   vancomycin (VANCOCIN) IVPB 1000 mg/200 mL premix     1,000 mg 200 mL/hr over 60 Minutes Intravenous Every 24 hours 06/06/17 0659     06/06/17 1000  cefTRIAXone (ROCEPHIN) 2 g in dextrose 5 % 50 mL IVPB     2 g 100 mL/hr over 30 Minutes Intravenous Every 12 hours 06/06/17 0654     06/06/17 0700  vancomycin (VANCOCIN) 1,500 mg in sodium chloride 0.9 % 500 mL IVPB     1,500 mg 250 mL/hr over 120 Minutes Intravenous  Once 06/06/17 0659 06/06/17 1256   06/06/17 0400  vancomycin (VANCOCIN) IVPB 1000 mg/200 mL premix  Status:  Discontinued     1,000 mg 200 mL/hr over 60 Minutes Intravenous Every 24 hours 06/05/17 0847 06/05/17 1148   06/05/17 2000  metroNIDAZOLE (FLAGYL) IVPB 500 mg     500 mg 100 mL/hr over 60 Minutes Intravenous Every 8 hours 06/05/17 1909     06/05/17 1400  cefTRIAXone (ROCEPHIN) 1 g in dextrose 5 % 50 mL IVPB  Status:  Discontinued     1 g 100 mL/hr over 30 Minutes Intravenous Every 24 hours 06/05/17 1148 06/06/17 0654   06/05/17 1200  azithromycin (ZITHROMAX) 500 mg in dextrose 5 % 250 mL IVPB     500 mg 250 mL/hr over 60 Minutes Intravenous Every 24 hours 06/05/17 1124     06/05/17 1000  piperacillin-tazobactam (ZOSYN) IVPB 3.375 g  Status:  Discontinued     3.375 g 12.5 mL/hr over 240 Minutes Intravenous Every 8 hours 06/05/17 0847 06/05/17 1148   06/05/17 0300  vancomycin (VANCOCIN) IVPB 1000 mg/200 mL premix     1,000 mg  200 mL/hr over 60 Minutes Intravenous  Once 06/05/17 0257 06/05/17 0436   06/05/17 0300  piperacillin-tazobactam (ZOSYN) IVPB 3.375 g     3.375 g 12.5 mL/hr over 240 Minutes Intravenous  Once 06/05/17 0257 06/05/17 0732       Subjective: Confused, without complaints. Eager to go home  Objective: Vitals:   06/06/17 0805 06/06/17 1140 06/06/17 1200 06/06/17 1558  BP: (!) 156/101 (!) 93/57  125/63  Pulse: 96 67 68 70  Resp: (!) 22 14 16 14   Temp: 98 F (36.7 C) (!) 97.5 F (36.4 C)  (!) 97.5 F (36.4 C)  TempSrc: Axillary Axillary  Oral    SpO2: 100% 96% 100% 99%  Weight:      Height:        Intake/Output Summary (Last 24 hours) at 06/06/2017 1705 Last data filed at 06/06/2017 1600 Gross per 24 hour  Intake 2396.25 ml  Output -  Net 2396.25 ml   Filed Weights   06/05/17 0523  Weight: 62 kg (136 lb 11 oz)    Examination:  General exam: Appears calm and comfortable  Respiratory system: Clear to auscultation. Respiratory effort normal. Cardiovascular system: S1 & S2 heard, RRR Gastrointestinal system: Abdomen is nondistended, soft and nontender. No organomegaly or masses felt. Normal bowel sounds heard. Central nervous system: Alert and oriented. No focal neurological deficits. Extremities: Symmetric 5 x 5 power. Skin: No rashes, lesions  Psychiatry: Mildly confused, Mood & affect appropriate.   Data Reviewed: I have personally reviewed following labs and imaging studies  CBC: Recent Labs  Lab 06/05/17 0053 06/06/17 0622  WBC 17.7* 13.6*  NEUTROABS 15.4*  --   HGB 13.4 10.7*  HCT 39.8 32.0*  MCV 94.1 92.0  PLT 158 111*   Basic Metabolic Panel: Recent Labs  Lab 06/05/17 0053 06/06/17 0622  NA 132* 135  K 3.4* 2.7*  CL 96* 99*  CO2 24 24  GLUCOSE 139* 127*  BUN 14 10  CREATININE 0.90 0.68  CALCIUM 8.5* 7.9*   GFR: Estimated Creatinine Clearance: 43.6 mL/min (by C-G formula based on SCr of 0.68 mg/dL). Liver Function Tests: Recent Labs  Lab 06/05/17 0053 06/06/17 0622  AST 37 59*  ALT 27 27  ALKPHOS 92 73  BILITOT 1.8* 1.7*  PROT 6.8 6.3*  ALBUMIN 3.5 2.7*   No results for input(s): LIPASE, AMYLASE in the last 168 hours. No results for input(s): AMMONIA in the last 168 hours. Coagulation Profile: No results for input(s): INR, PROTIME in the last 168 hours. Cardiac Enzymes: Recent Labs  Lab 06/05/17 2010  TROPONINI 0.50*   BNP (last 3 results) No results for input(s): PROBNP in the last 8760 hours. HbA1C: Recent Labs    06/05/17 1030  HGBA1C 6.2*   CBG: Recent Labs   Lab 06/05/17 1206 06/05/17 1720 06/05/17 2157  GLUCAP 155* 121* 155*   Lipid Profile: No results for input(s): CHOL, HDL, LDLCALC, TRIG, CHOLHDL, LDLDIRECT in the last 72 hours. Thyroid Function Tests: No results for input(s): TSH, T4TOTAL, FREET4, T3FREE, THYROIDAB in the last 72 hours. Anemia Panel: No results for input(s): VITAMINB12, FOLATE, FERRITIN, TIBC, IRON, RETICCTPCT in the last 72 hours. Sepsis Labs: Recent Labs  Lab 06/05/17 0105 06/06/17 0957  PROCALCITON  --  0.50  LATICACIDVEN 1.89 0.9    Recent Results (from the past 240 hour(s))  Blood Culture (routine x 2)     Status: Abnormal (Preliminary result)   Collection Time: 06/05/17 12:47 AM  Result Value Ref Range Status  Specimen Description BLOOD LEFT ANTECUBITAL  Final   Special Requests   Final    BOTTLES DRAWN AEROBIC AND ANAEROBIC Blood Culture adequate volume   Culture  Setup Time   Final    GRAM POSITIVE COCCI IN CHAINS IN BOTH AEROBIC AND ANAEROBIC BOTTLES CRITICAL RESULT CALLED TO, READ BACK BY AND VERIFIED WITH: PHARMD K PERKINS [id number] MLM    Culture (A)  Final    VIRIDANS STREPTOCOCCUS SUSCEPTIBILITIES TO FOLLOW    Report Status PENDING  Incomplete  Blood Culture ID Panel (Reflexed)     Status: Abnormal   Collection Time: 06/05/17 12:47 AM  Result Value Ref Range Status   Enterococcus species NOT DETECTED NOT DETECTED Final   Listeria monocytogenes NOT DETECTED NOT DETECTED Final   Staphylococcus species NOT DETECTED NOT DETECTED Final   Staphylococcus aureus NOT DETECTED NOT DETECTED Final   Streptococcus species DETECTED (A) NOT DETECTED Final    Comment: Not Enterococcus species, Streptococcus agalactiae, Streptococcus pyogenes, or Streptococcus pneumoniae. CRITICAL RESULT CALLED TO, READ BACK BY AND VERIFIED WITH: PHARMD K PERKINS [id number] MLM    Streptococcus agalactiae NOT DETECTED NOT DETECTED Final   Streptococcus pneumoniae NOT DETECTED NOT DETECTED Final   Streptococcus  pyogenes NOT DETECTED NOT DETECTED Final   Acinetobacter baumannii NOT DETECTED NOT DETECTED Final   Enterobacteriaceae species NOT DETECTED NOT DETECTED Final   Enterobacter cloacae complex NOT DETECTED NOT DETECTED Final   Escherichia coli NOT DETECTED NOT DETECTED Final   Klebsiella oxytoca NOT DETECTED NOT DETECTED Final   Klebsiella pneumoniae NOT DETECTED NOT DETECTED Final   Proteus species NOT DETECTED NOT DETECTED Final   Serratia marcescens NOT DETECTED NOT DETECTED Final   Haemophilus influenzae NOT DETECTED NOT DETECTED Final   Neisseria meningitidis NOT DETECTED NOT DETECTED Final   Pseudomonas aeruginosa NOT DETECTED NOT DETECTED Final   Candida albicans NOT DETECTED NOT DETECTED Final   Candida glabrata NOT DETECTED NOT DETECTED Final   Candida krusei NOT DETECTED NOT DETECTED Final   Candida parapsilosis NOT DETECTED NOT DETECTED Final   Candida tropicalis NOT DETECTED NOT DETECTED Final  Blood Culture (routine x 2)     Status: Abnormal (Preliminary result)   Collection Time: 06/05/17 12:52 AM  Result Value Ref Range Status   Specimen Description BLOOD RIGHT ANTECUBITAL  Final   Special Requests   Final    BOTTLES DRAWN AEROBIC AND ANAEROBIC Blood Culture adequate volume   Culture  Setup Time   Final    GRAM POSITIVE COCCI IN CHAINS IN BOTH AEROBIC AND ANAEROBIC BOTTLES CRITICAL VALUE NOTED.  VALUE IS CONSISTENT WITH PREVIOUSLY REPORTED AND CALLED VALUE.    Culture VIRIDANS STREPTOCOCCUS (A)  Final   Report Status PENDING  Incomplete  Respiratory Panel by PCR     Status: None   Collection Time: 06/05/17  3:42 AM  Result Value Ref Range Status   Adenovirus NOT DETECTED NOT DETECTED Final   Coronavirus 229E NOT DETECTED NOT DETECTED Final   Coronavirus HKU1 NOT DETECTED NOT DETECTED Final   Coronavirus NL63 NOT DETECTED NOT DETECTED Final   Coronavirus OC43 NOT DETECTED NOT DETECTED Final   Metapneumovirus NOT DETECTED NOT DETECTED Final   Rhinovirus / Enterovirus  NOT DETECTED NOT DETECTED Final   Influenza A NOT DETECTED NOT DETECTED Final   Influenza B NOT DETECTED NOT DETECTED Final   Parainfluenza Virus 1 NOT DETECTED NOT DETECTED Final   Parainfluenza Virus 2 NOT DETECTED NOT DETECTED Final   Parainfluenza Virus 3  NOT DETECTED NOT DETECTED Final   Parainfluenza Virus 4 NOT DETECTED NOT DETECTED Final   Respiratory Syncytial Virus NOT DETECTED NOT DETECTED Final   Bordetella pertussis NOT DETECTED NOT DETECTED Final   Chlamydophila pneumoniae NOT DETECTED NOT DETECTED Final   Mycoplasma pneumoniae NOT DETECTED NOT DETECTED Final     Radiology Studies: Dg Chest Port 1 View  Result Date: 06/05/2017 CLINICAL DATA:  Acute onset of fever.  Altered mental status. EXAM: PORTABLE CHEST 1 VIEW COMPARISON:  None. FINDINGS: The lungs are well-aerated. Diffuse right-sided and central left-sided airspace opacification may reflect pneumonia or asymmetric interstitial edema. A small left pleural effusion is suspected. No pneumothorax is seen. The cardiomediastinal silhouette is borderline normal in size. The patient is status post median sternotomy. No acute osseous abnormalities are seen. IMPRESSION: Diffuse right-sided and central left-sided airspace opacification may reflect pneumonia or asymmetric interstitial edema. Suspect small left pleural effusion. Electronically Signed   By: Roanna RaiderJeffery  Chang M.D.   On: 06/05/2017 01:09    Scheduled Meds: . apixaban  2.5 mg Oral BID  . atorvastatin  40 mg Oral q1800  . brimonidine  1 drop Both Eyes BID   And  . timolol  1 drop Both Eyes BID  . digoxin  0.125 mg Oral Daily  . docusate sodium  100 mg Oral BID  . insulin aspart  0-9 Units Subcutaneous TID WC  . latanoprost  1 drop Both Eyes QHS  . lisinopril  20 mg Oral Daily  . mouth rinse  15 mL Mouth Rinse BID  . QUEtiapine  25 mg Oral QHS   Continuous Infusions: . sodium chloride 50 mL/hr at 06/06/17 1600  . azithromycin Stopped (06/06/17 1254)  . cefTRIAXone  (ROCEPHIN)  IV Stopped (06/06/17 1249)  . metronidazole 500 mg (06/06/17 1256)  . [START ON 06/07/2017] vancomycin       LOS: 1 day   Rickey BarbaraStephen Chiu, MD [facility] Pager [phone]  If 7PM-7AM, please contact night-coverage [email or web address] Password [email or web address] 06/06/2017, 5:05 PM

## 2017-06-06 NOTE — Progress Notes (Signed)
In the setting of pneumococcal bacteremia repeat blood cultures x2, 2D echo, CT head ordered. Modified IV antibiotics to cover meningitis since this cannot be excluded: IV ceftriaxone 2 gm BID and added IV vancomycin. Also on IV azithromycin for CAP, poa.

## 2017-06-06 NOTE — Progress Notes (Signed)
Pharmacy Antibiotic Note  Shanna CiscoDolores Muckey is a 83 y.o. female admitted on 06/05/2017 with bacteremia, cannot r/o meningitis.  Pharmacy has been consulted for vancomycin dosing.  Plan: Rec'd vanc 1g ~28hr ago. Vancomycin 1500mg  x1 then 1000mg  IV every 24 hours.  Goal trough 15-20 mcg/mL.  Height: 5\' 7"  (170.2 cm) Weight: 136 lb 11 oz (62 kg) IBW/kg (Calculated) : 61.6  Temp (24hrs), Avg:99 F (37.2 C), Min:97.7 F (36.5 C), Max:99.7 F (37.6 C)  Recent Labs  Lab 06/05/17 0053 06/05/17 0105  WBC 17.7*  --   CREATININE 0.90  --   LATICACIDVEN  --  1.89    Estimated Creatinine Clearance: 38.8 mL/min (by C-G formula based on SCr of 0.9 mg/dL).    No Known Allergies   Thank you for allowing pharmacy to be a part of this patient's care.  Vernard GamblesVeronda Gauri Galvao, PharmD, BCPS  06/06/2017 6:57 AM

## 2017-06-06 NOTE — Progress Notes (Signed)
SLP Cancellation Note  Patient Details Name: Samantha Silva MRN: [id number] DOB: 11/02/1922   Cancelled treatment:       Reason Eval/Treat Not Completed: Fatigue/lethargy limiting ability to participate. Spoke with RN, to page SLP if pt becomes more alert.  Rondel BatonMary Beth Batool Silva, [place]MS, CCC-SLP Speech-Language Pathologist [phone]   Samantha Silva 06/06/2017, 10:50 AM

## 2017-06-06 NOTE — Progress Notes (Signed)
Patient;s son Samantha Silva called to inform family of use of soft wrist restaints on patient due to safety concerns and not being able to communicate with patient due to her dementia. Patient very combative and pulling at lines and staff, trying to kick staff members and screaming out at everybody. Left message on son;s phone and have not received a call back from him. [place] NP came to see patient and placed non-violent restraint order. Soft wrist restraints applied to patients wrist and mittens placed on hands. 4 RN's and 2 NT is room trying to get control of patient. Will continue to monitor closely.

## 2017-06-07 ENCOUNTER — Other Ambulatory Visit (HOSPITAL_COMMUNITY): Payer: Commercial Managed Care - HMO

## 2017-06-07 DIAGNOSIS — R7881 Bacteremia: Secondary | ICD-10-CM

## 2017-06-07 DIAGNOSIS — B955 Unspecified streptococcus as the cause of diseases classified elsewhere: Secondary | ICD-10-CM

## 2017-06-07 DIAGNOSIS — A408 Other streptococcal sepsis: Principal | ICD-10-CM

## 2017-06-07 LAB — COMPREHENSIVE METABOLIC PANEL
ALT: 24 U/L (ref 14–54)
AST: 38 U/L (ref 15–41)
Albumin: 2.3 g/dL — ABNORMAL LOW (ref 3.5–5.0)
Alkaline Phosphatase: 66 U/L (ref 38–126)
Anion gap: 7 (ref 5–15)
BILIRUBIN TOTAL: 1.2 mg/dL (ref 0.3–1.2)
BUN: 14 mg/dL (ref 6–20)
CALCIUM: 7.8 mg/dL — AB (ref 8.9–10.3)
CHLORIDE: 104 mmol/L (ref 101–111)
CO2: 25 mmol/L (ref 22–32)
CREATININE: 0.68 mg/dL (ref 0.44–1.00)
Glucose, Bld: 109 mg/dL — ABNORMAL HIGH (ref 65–99)
Potassium: 3.2 mmol/L — ABNORMAL LOW (ref 3.5–5.1)
Sodium: 136 mmol/L (ref 135–145)
TOTAL PROTEIN: 5.5 g/dL — AB (ref 6.5–8.1)

## 2017-06-07 LAB — GLUCOSE, CAPILLARY
GLUCOSE-CAPILLARY: 113 mg/dL — AB (ref 65–99)
GLUCOSE-CAPILLARY: 133 mg/dL — AB (ref 65–99)
Glucose-Capillary: 234 mg/dL — ABNORMAL HIGH (ref 65–99)
Glucose-Capillary: 96 mg/dL (ref 65–99)

## 2017-06-07 LAB — CULTURE, BLOOD (ROUTINE X 2)
SPECIAL REQUESTS: ADEQUATE
Special Requests: ADEQUATE

## 2017-06-07 LAB — CBC
HCT: 33.4 % — ABNORMAL LOW (ref 36.0–46.0)
Hemoglobin: 11.2 g/dL — ABNORMAL LOW (ref 12.0–15.0)
MCH: 31.3 pg (ref 26.0–34.0)
MCHC: 33.5 g/dL (ref 30.0–36.0)
MCV: 93.3 fL (ref 78.0–100.0)
PLATELETS: 139 10*3/uL — AB (ref 150–400)
RBC: 3.58 MIL/uL — AB (ref 3.87–5.11)
RDW: 13.4 % (ref 11.5–15.5)
WBC: 12.1 10*3/uL — AB (ref 4.0–10.5)

## 2017-06-07 LAB — MAGNESIUM: MAGNESIUM: 1.8 mg/dL (ref 1.7–2.4)

## 2017-06-07 LAB — PROCALCITONIN: Procalcitonin: 0.57 ng/mL

## 2017-06-07 MED ORDER — ACETAMINOPHEN 325 MG PO TABS
650.0000 mg | ORAL_TABLET | Freq: Four times a day (QID) | ORAL | Status: DC | PRN
  Administered 2017-06-07 – 2017-06-10 (×5): 650 mg via ORAL
  Filled 2017-06-07 (×5): qty 2

## 2017-06-07 MED ORDER — POTASSIUM CHLORIDE CRYS ER 20 MEQ PO TBCR
40.0000 meq | EXTENDED_RELEASE_TABLET | Freq: Two times a day (BID) | ORAL | Status: AC
  Administered 2017-06-07 (×2): 40 meq via ORAL
  Filled 2017-06-07 (×2): qty 2

## 2017-06-07 NOTE — Progress Notes (Signed)
PROGRESS NOTE    Samantha Silva  YNW:[id number] DOB: 03/21/1923 DOA: 06/05/2017 PCP: Patient, No Pcp Per    Brief Narrative:  Please see admit H and P for details. Briefly, 83 y.o. female with medical history significant of open heart surgery, atrial fibrillation, hypertension and hypercholesteremia. [place] Family here.  Pt has had increasing dementia and requires a lot of care from family.  Pt has recently developed increased confusion since the move.  Her daughter inlaw has been sleeping in the room with her.  She reported increased confusion, Decreased ability to walk.  Pt has developed a cough and had increased shortness of breath   Assessment & Plan:   Active Problems:   Lobar pneumonia (HCC)   Sepsis (HCC)  Active Problems:   Lobar pneumonia with Sepsis present on admission  -CXR reviewed. Patient with R and central L sided airspace consolidation noted -Patient started on azithromycin and rocephin with flagyl added -continue to wean O2 as tolerated -Clinically improving. Will hold flagyl  Viridians strep bacteremia -sensitive to rocephin and vanc -2d echo is pending -If 2d echo unremarkable, may benefit from TEE given hx of bioprothetic valve  Atrial Fibrillation --continue eliquis. No evidence of acute blood loss --continue digoxin -Presently rate controlled. Continue to monitor  Hypertension --continue lisinopril --Continue hydralazine as tolerated - continue to monitor for now  Hypercholesterolemia --continue Lipitor as ordered  Likely Hx of Dementia -Family at bedside who describes long history of progressive decline and memory loss -suspect element of dementia -Patient noted to have increased agitation while in hospital. Good results with trial of Seroquel at night -Continue Haldol PRN   DVT prophylaxis: Eliquis Code Status: Full Family Communication: Pt in room, family at bedside Disposition Plan:  Uncertain at this time  Consultants:     Procedures:     Antimicrobials: Anti-infectives (From admission, onward)   Start     Dose/Rate Route Frequency Ordered Stop   06/07/17 0800  vancomycin (VANCOCIN) IVPB 1000 mg/200 mL premix     1,000 mg 200 mL/hr over 60 Minutes Intravenous Every 24 hours 06/06/17 0659     06/06/17 1000  cefTRIAXone (ROCEPHIN) 2 g in dextrose 5 % 50 mL IVPB     2 g 100 mL/hr over 30 Minutes Intravenous Every 12 hours 06/06/17 0654     06/06/17 0700  vancomycin (VANCOCIN) 1,500 mg in sodium chloride 0.9 % 500 mL IVPB     1,500 mg 250 mL/hr over 120 Minutes Intravenous  Once 06/06/17 0659 06/06/17 1256   06/06/17 0400  vancomycin (VANCOCIN) IVPB 1000 mg/200 mL premix  Status:  Discontinued     1,000 mg 200 mL/hr over 60 Minutes Intravenous Every 24 hours 06/05/17 0847 06/05/17 1148   06/05/17 2000  metroNIDAZOLE (FLAGYL) IVPB 500 mg     500 mg 100 mL/hr over 60 Minutes Intravenous Every 8 hours 06/05/17 1909     06/05/17 1400  cefTRIAXone (ROCEPHIN) 1 g in dextrose 5 % 50 mL IVPB  Status:  Discontinued     1 g 100 mL/hr over 30 Minutes Intravenous Every 24 hours 06/05/17 1148 06/06/17 0654   06/05/17 1200  azithromycin (ZITHROMAX) 500 mg in dextrose 5 % 250 mL IVPB     500 mg 250 mL/hr over 60 Minutes Intravenous Every 24 hours 06/05/17 1124     06/05/17 1000  piperacillin-tazobactam (ZOSYN) IVPB 3.375 g  Status:  Discontinued     3.375 g 12.5  mL/hr over 240 Minutes Intravenous Every 8 hours 06/05/17 0847 06/05/17 1148   06/05/17 0300  vancomycin (VANCOCIN) IVPB 1000 mg/200 mL premix     1,000 mg 200 mL/hr over 60 Minutes Intravenous  Once 06/05/17 0257 06/05/17 0436   06/05/17 0300  piperacillin-tazobactam (ZOSYN) IVPB 3.375 g     3.375 g 12.5 mL/hr over 240 Minutes Intravenous  Once 06/05/17 0257 06/05/17 0732      Subjective: Reports feeling better and is eager to go home  Objective: Vitals:   06/07/17 0000 06/07/17 0409 06/07/17 0709  06/07/17 1322  BP:  (!) 152/63  132/80  Pulse: 96 81    Resp: (!) 27 17    Temp:  (!) 102 F (38.9 C) 98.7 F (37.1 C) 98.5 F (36.9 C)  TempSrc:  Axillary Oral Oral  SpO2: 98% 96%    Weight:      Height:        Intake/Output Summary (Last 24 hours) at 06/07/2017 1726 Last data filed at 06/07/2017 1300 Gross per 24 hour  Intake 2480 ml  Output -  Net 2480 ml   Filed Weights   06/05/17 0523  Weight: 62 kg (136 lb 11 oz)    Examination: General exam: Conversant, in no acute distress Respiratory system: normal chest rise, clear, no audible wheezing Cardiovascular system: regular rhythm, s1-s2, systolic murmur Gastrointestinal system: Nondistended, nontender, pos BS Central nervous system: No seizures, no tremors Extremities: No cyanosis, no joint deformities Skin: No rashes, no pallor Psychiatry: Affect normal // no auditory hallucinations    Data Reviewed: I have personally reviewed following labs and imaging studies  CBC: Recent Labs  Lab 06/05/17 0053 06/06/17 0622 06/07/17 0211  WBC 17.7* 13.6* 12.1*  NEUTROABS 15.4*  --   --   HGB 13.4 10.7* 11.2*  HCT 39.8 32.0* 33.4*  MCV 94.1 92.0 93.3  PLT 158 111* 139*   Basic Metabolic Panel: Recent Labs  Lab 06/05/17 0053 06/06/17 0622 06/07/17 0211 06/07/17 0759  NA 132* 135 136  --   K 3.4* 2.7* 3.2*  --   CL 96* 99* 104  --   CO2 24 24 25   --   GLUCOSE 139* 127* 109*  --   BUN 14 10 14   --   CREATININE 0.90 0.68 0.68  --   CALCIUM 8.5* 7.9* 7.8*  --   MG  --   --   --  1.8   GFR: Estimated Creatinine Clearance: 43.6 mL/min (by C-G formula based on SCr of 0.68 mg/dL). Liver Function Tests: Recent Labs  Lab 06/05/17 0053 06/06/17 0622 06/07/17 0211  AST 37 59* 38  ALT 27 27 24   ALKPHOS 92 73 66  BILITOT 1.8* 1.7* 1.2  PROT 6.8 6.3* 5.5*  ALBUMIN 3.5 2.7* 2.3*   No results for input(s): LIPASE, AMYLASE in the last 168 hours. No results for input(s): AMMONIA in the last 168  hours. Coagulation Profile: No results for input(s): INR, PROTIME in the last 168 hours. Cardiac Enzymes: Recent Labs  Lab 06/05/17 2010  TROPONINI 0.50*   BNP (last 3 results) No results for input(s): PROBNP in the last 8760 hours. HbA1C: Recent Labs    06/05/17 1030  HGBA1C 6.2*   CBG: Recent Labs  Lab 06/05/17 1720 06/05/17 2157 06/06/17 2109 06/07/17 0817 06/07/17 1312  GLUCAP 121* 155* 98 113* 234*   Lipid Profile: No results for input(s): CHOL, HDL, LDLCALC, TRIG, CHOLHDL, LDLDIRECT in the last 72 hours. Thyroid Function Tests: No  results for input(s): TSH, T4TOTAL, FREET4, T3FREE, THYROIDAB in the last 72 hours. Anemia Panel: No results for input(s): VITAMINB12, FOLATE, FERRITIN, TIBC, IRON, RETICCTPCT in the last 72 hours. Sepsis Labs: Recent Labs  Lab 06/05/17 0105 06/06/17 0957 06/07/17 0211  PROCALCITON  --  0.50 0.57  LATICACIDVEN 1.89 0.9  --     Recent Results (from the past 240 hour(s))  Blood Culture (routine x 2)     Status: Abnormal   Collection Time: 06/05/17 12:47 AM  Result Value Ref Range Status   Specimen Description BLOOD LEFT ANTECUBITAL  Final   Special Requests   Final    BOTTLES DRAWN AEROBIC AND ANAEROBIC Blood Culture adequate volume   Culture  Setup Time   Final    GRAM POSITIVE COCCI IN CHAINS IN BOTH AEROBIC AND ANAEROBIC BOTTLES CRITICAL RESULT CALLED TO, READ BACK BY AND VERIFIED WITH: PHARMD K PERKINS [id number] MLM    Culture VIRIDANS STREPTOCOCCUS (A)  Final   Report Status 06/07/2017 FINAL  Final   Organism ID, Bacteria VIRIDANS STREPTOCOCCUS  Final      Susceptibility   Viridans streptococcus - MIC*    ERYTHROMYCIN >=8 RESISTANT Resistant     TETRACYCLINE >=16 RESISTANT Resistant     VANCOMYCIN 0.5 SENSITIVE Sensitive     CLINDAMYCIN >=1 RESISTANT Resistant     PENICILLIN Value in next row Intermediate      INTERMEDIATE0.25    CEFTRIAXONE Value in next row Sensitive      SENSITIVE0.5    * VIRIDANS  STREPTOCOCCUS  Blood Culture ID Panel (Reflexed)     Status: Abnormal   Collection Time: 06/05/17 12:47 AM  Result Value Ref Range Status   Enterococcus species NOT DETECTED NOT DETECTED Final   Listeria monocytogenes NOT DETECTED NOT DETECTED Final   Staphylococcus species NOT DETECTED NOT DETECTED Final   Staphylococcus aureus NOT DETECTED NOT DETECTED Final   Streptococcus species DETECTED (A) NOT DETECTED Final    Comment: Not Enterococcus species, Streptococcus agalactiae, Streptococcus pyogenes, or Streptococcus pneumoniae. CRITICAL RESULT CALLED TO, READ BACK BY AND VERIFIED WITH: PHARMD K PERKINS [id number] MLM    Streptococcus agalactiae NOT DETECTED NOT DETECTED Final   Streptococcus pneumoniae NOT DETECTED NOT DETECTED Final   Streptococcus pyogenes NOT DETECTED NOT DETECTED Final   Acinetobacter baumannii NOT DETECTED NOT DETECTED Final   Enterobacteriaceae species NOT DETECTED NOT DETECTED Final   Enterobacter cloacae complex NOT DETECTED NOT DETECTED Final   Escherichia coli NOT DETECTED NOT DETECTED Final   Klebsiella oxytoca NOT DETECTED NOT DETECTED Final   Klebsiella pneumoniae NOT DETECTED NOT DETECTED Final   Proteus species NOT DETECTED NOT DETECTED Final   Serratia marcescens NOT DETECTED NOT DETECTED Final   Haemophilus influenzae NOT DETECTED NOT DETECTED Final   Neisseria meningitidis NOT DETECTED NOT DETECTED Final   Pseudomonas aeruginosa NOT DETECTED NOT DETECTED Final   Candida albicans NOT DETECTED NOT DETECTED Final   Candida glabrata NOT DETECTED NOT DETECTED Final   Candida krusei NOT DETECTED NOT DETECTED Final   Candida parapsilosis NOT DETECTED NOT DETECTED Final   Candida tropicalis NOT DETECTED NOT DETECTED Final  Blood Culture (routine x 2)     Status: Abnormal   Collection Time: 06/05/17 12:52 AM  Result Value Ref Range Status   Specimen Description BLOOD RIGHT ANTECUBITAL  Final   Special Requests   Final    BOTTLES DRAWN AEROBIC AND  ANAEROBIC Blood Culture adequate volume   Culture  Setup Time  Final    GRAM POSITIVE COCCI IN CHAINS IN BOTH AEROBIC AND ANAEROBIC BOTTLES CRITICAL VALUE NOTED.  VALUE IS CONSISTENT WITH PREVIOUSLY REPORTED AND CALLED VALUE.    Culture (A)  Final    VIRIDANS STREPTOCOCCUS SUSCEPTIBILITIES PERFORMED ON PREVIOUS CULTURE WITHIN THE LAST 5 DAYS.    Report Status 06/07/2017 FINAL  Final  Respiratory Panel by PCR     Status: None   Collection Time: 06/05/17  3:42 AM  Result Value Ref Range Status   Adenovirus NOT DETECTED NOT DETECTED Final   Coronavirus 229E NOT DETECTED NOT DETECTED Final   Coronavirus HKU1 NOT DETECTED NOT DETECTED Final   Coronavirus NL63 NOT DETECTED NOT DETECTED Final   Coronavirus OC43 NOT DETECTED NOT DETECTED Final   Metapneumovirus NOT DETECTED NOT DETECTED Final   Rhinovirus / Enterovirus NOT DETECTED NOT DETECTED Final   Influenza A NOT DETECTED NOT DETECTED Final   Influenza B NOT DETECTED NOT DETECTED Final   Parainfluenza Virus 1 NOT DETECTED NOT DETECTED Final   Parainfluenza Virus 2 NOT DETECTED NOT DETECTED Final   Parainfluenza Virus 3 NOT DETECTED NOT DETECTED Final   Parainfluenza Virus 4 NOT DETECTED NOT DETECTED Final   Respiratory Syncytial Virus NOT DETECTED NOT DETECTED Final   Bordetella pertussis NOT DETECTED NOT DETECTED Final   Chlamydophila pneumoniae NOT DETECTED NOT DETECTED Final   Mycoplasma pneumoniae NOT DETECTED NOT DETECTED Final  Culture, blood (routine x 2)     Status: None (Preliminary result)   Collection Time: 06/06/17  6:21 AM  Result Value Ref Range Status   Specimen Description BLOOD RIGHT ANTECUBITAL  Final   Special Requests IN PEDIATRIC BOTTLE Blood Culture adequate volume  Final   Culture NO GROWTH 1 DAY  Final   Report Status PENDING  Incomplete  Culture, blood (routine x 2)     Status: None (Preliminary result)   Collection Time: 06/06/17  6:28 AM  Result Value Ref Range Status   Specimen Description BLOOD LEFT  ANTECUBITAL  Final   Special Requests IN PEDIATRIC BOTTLE Blood Culture adequate volume  Final   Culture NO GROWTH 1 DAY  Final   Report Status PENDING  Incomplete     Radiology Studies: Ct Head Wo Contrast  Result Date: 06/06/2017 CLINICAL DATA:  Altered level of consciousness. EXAM: CT HEAD WITHOUT CONTRAST TECHNIQUE: Contiguous axial images were obtained from the base of the skull through the vertex without intravenous contrast. COMPARISON:  CT 09/03/2005 FINDINGS: Brain: Marked progression of cerebral atrophy which is now moderate to advanced. Extensive white matter disease has developed since the prior study. This appears chronic. Negative for acute infarct, hemorrhage, or mass lesion. Vascular: Negative for hyperdense vessel Skull: Negative Sinuses/Orbits: Paranasal sinuses clear.  Bilateral lens replacement Other: None IMPRESSION: Marked progression of atrophy and chronic white matter ischemia. No acute abnormality. Electronically Signed   By: Marlan Palauharles  Clark M.D.   On: 06/06/2017 20:36    Scheduled Meds: . apixaban  2.5 mg Oral BID  . atorvastatin  40 mg Oral q1800  . brimonidine  1 drop Both Eyes BID   And  . timolol  1 drop Both Eyes BID  . digoxin  0.125 mg Oral Daily  . docusate sodium  100 mg Oral BID  . insulin aspart  0-9 Units Subcutaneous TID WC  . latanoprost  1 drop Both Eyes QHS  . lisinopril  20 mg Oral Daily  . mouth rinse  15 mL Mouth Rinse BID  . potassium chloride  40 mEq Oral BID  . QUEtiapine  25 mg Oral QHS   Continuous Infusions: . sodium chloride Stopped (06/07/17 0830)  . azithromycin Stopped (06/07/17 1345)  . cefTRIAXone (ROCEPHIN)  IV Stopped (06/07/17 1100)  . metronidazole Stopped (06/07/17 1344)  . vancomycin Stopped (06/07/17 0930)     LOS: 2 days   Rickey Barbara, MD [facility] Pager [phone]  If 7PM-7AM, please contact night-coverage [email or web address] Password [email or web address] 06/07/2017, 5:26 PM

## 2017-06-07 NOTE — Evaluation (Signed)
Clinical/Bedside Swallow Evaluation Patient Details  Name: Samantha Silva MRN: [id number] Date of Birth: 05/15/1923  Today's Date: 06/07/2017 Time: SLP Start Time (ACUTE ONLY): 0825 SLP Stop Time (ACUTE ONLY): 0845 SLP Time Calculation (min) (ACUTE ONLY): 20 min  Past Medical History:  Past Medical History:  Diagnosis Date  . High cholesterol   . Hypertension    Past Surgical History: History reviewed. No pertinent surgical history. HPI:  83 y.o.femalewith medical history significant ofopen heart surgery, atrial fibrillation, hypertension and hypercholesteremia. Pt has had increasing dementia and requires a lot of care from family. Pt has developed a cough and had increased shortness of breath; admitted with PNA and sepsis. CXR 06/05/17 showed diffuse right-sided and central left-sided airspace opacification.   Assessment / Plan / Recommendation Clinical Impression  Pt is alert but confused, she is HOH and cognition interferes with ability to follow commands consistently. Despite confusion and difficulty following commands, oropharyngeal swallow appears at bedside to be within functional limits with adequate airway protection. Oral stage is swift, timely with no holding. Pt is able to self-feed with set-up. No overt signs of aspiration observed despite challenging with consecutive straw sips of thin liquids in excess of 3oz. Per RN pt suspicious of medications and best success is with puree. Recommend regular diet with thin liquids, no further skilled ST needs identified. Will s/o.   SLP Visit Diagnosis: Dysphagia, unspecified (R13.10)    Aspiration Risk  Mild aspiration risk    Diet Recommendation Regular;Thin liquid   Liquid Administration via: Cup;Straw Medication Administration: Crushed with puree(per RN best success) Supervision: Patient able to self feed Compensations: Slow rate;Small sips/bites;Minimize environmental distractions Postural Changes: Seated upright at 90  degrees    Other  Recommendations Oral Care Recommendations: Oral care BID   Follow up Recommendations None      Frequency and Duration            Prognosis        Swallow Study   General Date of Onset: 06/05/17 HPI: 83 y.o.femalewith medical history significant ofopen heart surgery, atrial fibrillation, hypertension and hypercholesteremia. Pt has had increasing dementia and requires a lot of care from family. Pt has developed a cough and had increased shortness of breath; admitted with PNA and sepsis. CXR 06/05/17 showed diffuse right-sided and central left-sided airspace opacification. Type of Study: Bedside Swallow Evaluation Previous Swallow Assessment: none on file Diet Prior to this Study: NPO Temperature Spikes Noted: Yes(102) Respiratory Status: Nasal cannula History of Recent Intubation: No Behavior/Cognition: Alert;Cooperative;Confused;Requires cueing Oral Cavity Assessment: Within Functional Limits Oral Care Completed by SLP: No Oral Cavity - Dentition: Adequate natural dentition Vision: Functional for self-feeding Self-Feeding Abilities: Able to feed self Patient Positioning: Upright in bed Baseline Vocal Quality: Normal Volitional Cough: Cognitively unable to elicit Volitional Swallow: Unable to elicit    Oral/Motor/Sensory Function Overall Oral Motor/Sensory Function: Within functional limits   Ice Chips Ice chips: Within functional limits Presentation: Spoon   Thin Liquid Thin Liquid: Within functional limits Presentation: Cup;Self Fed;Straw    Nectar Thick Nectar Thick Liquid: Not tested   Honey Thick Honey Thick Liquid: Not tested   Puree Puree: Within functional limits Presentation: Self Fed;Spoon   Solid   GO   Solid: Within functional limits Presentation: Self Fed       Samantha BatonMary Beth Virgil Slinger, MS, CCC-SLP Speech-Language Pathologist [phone]  Samantha Silva 06/07/2017,9:41 AM

## 2017-06-08 ENCOUNTER — Inpatient Hospital Stay (HOSPITAL_COMMUNITY): Payer: Medicare HMO

## 2017-06-08 DIAGNOSIS — G9341 Metabolic encephalopathy: Secondary | ICD-10-CM

## 2017-06-08 DIAGNOSIS — R262 Difficulty in walking, not elsewhere classified: Secondary | ICD-10-CM

## 2017-06-08 DIAGNOSIS — I361 Nonrheumatic tricuspid (valve) insufficiency: Secondary | ICD-10-CM

## 2017-06-08 LAB — ECHOCARDIOGRAM COMPLETE
AV Mean grad: 41 mmHg
AV Peak grad: 87 mmHg
AV VEL mean LVOT/AV: 0.25
AV peak Index: 0.3
AV pk vel: 466 cm/s
AV vel: 0.54
AVAREAMEANV: 0.51 cm2
AVAREAMEANVIN: 0.3 cm2/m2
AVAREAVTI: 0.51 cm2
AVAREAVTIIND: 0.32 cm2/m2
Ao pk vel: 0.25 m/s
CHL CUP MV DEC (S): 222
DOP CAL AO MEAN VELOCITY: 291 cm/s
EWDT: 222 ms
FS: 34 % (ref 28–44)
Height: 67 in
IV/PV OW: 1.05
LA ID, A-P, ES: 45 mm
LA diam end sys: 45 mm
LA diam index: 2.63 cm/m2
LA vol A4C: 64.6 ml
LA vol: 75.2 mL
LAVOLIN: 43.9 mL/m2
LVOT VTI: 21.6 cm
LVOT area: 2.01 cm2
LVOT peak grad rest: 6 mmHg
LVOTD: 16 mm
LVOTPV: 118 cm/s
LVOTSV: 43 mL
LVOTVTI: 0.27 cm
MV pk A vel: 154 m/s
MVPG: 9 mmHg
MVPKEVEL: 148 m/s
PW: 12.8 mm — AB (ref 0.6–1.1)
RV TAPSE: 17.9 mm
RV sys press: 52 mmHg
Reg peak vel: 305 cm/s
TR max vel: 305 cm/s
VTI: 80.7 cm
Valve area index: 0.32
Valve area: 0.54 cm2
Weight: 2186.96 oz

## 2017-06-08 LAB — CBC
HCT: 34.2 % — ABNORMAL LOW (ref 36.0–46.0)
HEMOGLOBIN: 11.4 g/dL — AB (ref 12.0–15.0)
MCH: 31.1 pg (ref 26.0–34.0)
MCHC: 33.3 g/dL (ref 30.0–36.0)
MCV: 93.4 fL (ref 78.0–100.0)
Platelets: 153 10*3/uL (ref 150–400)
RBC: 3.66 MIL/uL — AB (ref 3.87–5.11)
RDW: 13.4 % (ref 11.5–15.5)
WBC: 11.9 10*3/uL — ABNORMAL HIGH (ref 4.0–10.5)

## 2017-06-08 LAB — PROCALCITONIN: Procalcitonin: 0.31 ng/mL

## 2017-06-08 LAB — BASIC METABOLIC PANEL
ANION GAP: 7 (ref 5–15)
BUN: 12 mg/dL (ref 6–20)
CHLORIDE: 106 mmol/L (ref 101–111)
CO2: 24 mmol/L (ref 22–32)
Calcium: 8 mg/dL — ABNORMAL LOW (ref 8.9–10.3)
Creatinine, Ser: 0.61 mg/dL (ref 0.44–1.00)
GFR calc non Af Amer: >60 mL/min (ref >60)
Glucose, Bld: 132 mg/dL — ABNORMAL HIGH (ref 65–99)
POTASSIUM: 3.8 mmol/L (ref 3.5–5.1)
Sodium: 137 mmol/L (ref 135–145)

## 2017-06-08 LAB — GLUCOSE, CAPILLARY
GLUCOSE-CAPILLARY: 131 mg/dL — AB (ref 65–99)
GLUCOSE-CAPILLARY: 147 mg/dL — AB (ref 65–99)
Glucose-Capillary: 129 mg/dL — ABNORMAL HIGH (ref 65–99)
Glucose-Capillary: 193 mg/dL — ABNORMAL HIGH (ref 65–99)

## 2017-06-08 MED ORDER — BRIMONIDINE TARTRATE-TIMOLOL 0.2-0.5 % OP SOLN: 1.0000 [drp] | Freq: Two times a day (BID) | OPHTHALMIC | Status: DC

## 2017-06-08 MED ORDER — AMLODIPINE BESYLATE 5 MG PO TABS
5.0000 mg | ORAL_TABLET | Freq: Every day | ORAL | Status: DC
  Administered 2017-06-08: 5 mg via ORAL
  Filled 2017-06-08: qty 1

## 2017-06-08 MED ORDER — HYDRALAZINE HCL 20 MG/ML IJ SOLN
10.0000 mg | Freq: Four times a day (QID) | INTRAMUSCULAR | Status: DC | PRN
  Administered 2017-06-10: 10 mg via INTRAVENOUS
  Filled 2017-06-08: qty 1

## 2017-06-08 MED ORDER — POTASSIUM CHLORIDE CRYS ER 20 MEQ PO TBCR
40.0000 meq | EXTENDED_RELEASE_TABLET | Freq: Once | ORAL | Status: AC
  Administered 2017-06-08: 40 meq via ORAL
  Filled 2017-06-08: qty 2

## 2017-06-08 NOTE — Progress Notes (Signed)
PROGRESS NOTE    Samantha Silva  ZOX:[id number] DOB: January 14, 1923 DOA: 06/05/2017 PCP: Patient, No Pcp Per    Brief Narrative:  83 y.o. female with medical history significant for open heart surgery, atrial fibrillation on eliquis, hypertension and hypercholesteremia. Recently moved to [place] from [place] to live with son here. Increasing confusion in the setting of dementia.  Upon presentation CAP, poa complicated by strep bacteremia. Repeat blood cx x 2 no growth 1 day (06/08/17).  No acute events overnight. Seen and examined with no family members at bedside. Confused with aggressive behavior. Does not appear to be in acute distress. Pushes back and is aggressive when examined.   Assessment & Plan:   Active Problems:   Lobar pneumonia (HCC)   Sepsis (HCC)  Active Problems:  Sepsis 2/2 to Lobar community acquired pneumonia vs viridans strep bacteremia, present on admission, improving -CXR reviewed. Patient with R and central L sided airspace consolidation noted -on IV azithromycin and rocephin day # 2 -passed swallow evaluation by speech therapist -continue to wean O2 as tolerated  Viridians strep bacteremia, poa -sensitive to rocephin and vanc -2d echo in process -If 2d echo unremarkable, may benefit from TEE given hx of bioprothetic valve -repeat blood cx x2 no growth in 1 day  Atrial Fibrillation -rate controlled -continue eliquis for DVT ppx. -continue digoxin  Hypertension -Accelerated HTN -continue lisinopril- added amlodipine 5 mg daily -Continue IV hydralazine 10 mg q6h prn for sbp>180 or dbp>105  Hypercholesterolemia --continue Lipitor as ordered  Likely Hx of Dementia with behavior disturbances -Family reports long history of progressive decline and memory loss -Increased agitation in hospital -Good results with trial of Seroquel at night -Continue Haldol PRN -reorient as needed  Ambulatory dysfunction -PT to evaluate when more stable   DVT  prophylaxis: Eliquis Code Status: Full Family Communication: No family member at bedside Disposition Plan: will stay another midnight to continue IV antibiotics  Consultants:   None  Procedures:  None  Antimicrobials: Anti-infectives (From admission, onward)   Start     Dose/Rate Route Frequency Ordered Stop   06/07/17 0800  vancomycin (VANCOCIN) IVPB 1000 mg/200 mL premix  Status:  Discontinued     1,000 mg 200 mL/hr over 60 Minutes Intravenous Every 24 hours 06/06/17 0659 06/07/17 1732   06/06/17 1000  cefTRIAXone (ROCEPHIN) 2 g in dextrose 5 % 50 mL IVPB     2 g 100 mL/hr over 30 Minutes Intravenous Every 12 hours 06/06/17 0654     06/06/17 0700  vancomycin (VANCOCIN) 1,500 mg in sodium chloride 0.9 % 500 mL IVPB     1,500 mg 250 mL/hr over 120 Minutes Intravenous  Once 06/06/17 0659 06/06/17 1256   06/06/17 0400  vancomycin (VANCOCIN) IVPB 1000 mg/200 mL premix  Status:  Discontinued     1,000 mg 200 mL/hr over 60 Minutes Intravenous Every 24 hours 06/05/17 0847 06/05/17 1148   06/05/17 2000  metroNIDAZOLE (FLAGYL) IVPB 500 mg  Status:  Discontinued     500 mg 100 mL/hr over 60 Minutes Intravenous Every 8 hours 06/05/17 1909 06/07/17 1730   06/05/17 1400  cefTRIAXone (ROCEPHIN) 1 g in dextrose 5 % 50 mL IVPB  Status:  Discontinued     1 g 100 mL/hr over 30 Minutes Intravenous Every 24 hours 06/05/17 1148 06/06/17 0654   06/05/17 1200  azithromycin (ZITHROMAX) 500 mg in dextrose 5 % 250 mL IVPB     500 mg 250 mL/hr over 60 Minutes Intravenous Every 24 hours 06/05/17  1124     06/05/17 1000  piperacillin-tazobactam (ZOSYN) IVPB 3.375 g  Status:  Discontinued     3.375 g 12.5 mL/hr over 240 Minutes Intravenous Every 8 hours 06/05/17 0847 06/05/17 1148   06/05/17 0300  vancomycin (VANCOCIN) IVPB 1000 mg/200 mL premix     1,000 mg 200 mL/hr over 60 Minutes Intravenous  Once 06/05/17 0257 06/05/17 0436   06/05/17 0300  piperacillin-tazobactam (ZOSYN) IVPB 3.375 g     3.375  g 12.5 mL/hr over 240 Minutes Intravenous  Once 06/05/17 0257 06/05/17 0732      Objective: Vitals:   06/07/17 2359 06/08/17 0000 06/08/17 0311 06/08/17 0400  BP: (!) 184/101  (!) 182/104   Pulse:   96   Resp: (!) 25 (!) 21 (!) 24 19  Temp:   98.2 F (36.8 C)   TempSrc:   Axillary   SpO2:   90%   Weight:      Height:        Intake/Output Summary (Last 24 hours) at 06/08/2017 1610 Last data filed at 06/07/2017 2302 Gross per 24 hour  Intake 2600 ml  Output -  Net 2600 ml   Filed Weights   06/05/17 0523  Weight: 62 kg (136 lb 11 oz)    Examination: General exam: 83 yo CF NAD confused and agressive Respiratory system: normal chest rise, clear, no audible wheezing Cardiovascular system: regular rhythm, s1-s2, systolic murmur Gastrointestinal system: Nondistended, nontender, pos BS Central nervous system: No seizures, no tremors Extremities: No cyanosis, no joint deformities Skin: No rashes, no pallor Psychiatry: unable to assess due to AMS   Data Reviewed: I have personally reviewed following labs and imaging studies  CBC: Recent Labs  Lab 06/05/17 0053 06/06/17 0622 06/07/17 0211  WBC 17.7* 13.6* 12.1*  NEUTROABS 15.4*  --   --   HGB 13.4 10.7* 11.2*  HCT 39.8 32.0* 33.4*  MCV 94.1 92.0 93.3  PLT 158 111* 139*   Basic Metabolic Panel: Recent Labs  Lab 06/05/17 0053 06/06/17 0622 06/07/17 0211 06/07/17 0759  NA 132* 135 136  --   K 3.4* 2.7* 3.2*  --   CL 96* 99* 104  --   CO2 24 24 25   --   GLUCOSE 139* 127* 109*  --   BUN 14 10 14   --   CREATININE 0.90 0.68 0.68  --   CALCIUM 8.5* 7.9* 7.8*  --   MG  --   --   --  1.8   GFR: Estimated Creatinine Clearance: 43.6 mL/min (by C-G formula based on SCr of 0.68 mg/dL). Liver Function Tests: Recent Labs  Lab 06/05/17 0053 06/06/17 0622 06/07/17 0211  AST 37 59* 38  ALT 27 27 24   ALKPHOS 92 73 66  BILITOT 1.8* 1.7* 1.2  PROT 6.8 6.3* 5.5*  ALBUMIN 3.5 2.7* 2.3*   No results for input(s):  LIPASE, AMYLASE in the last 168 hours. No results for input(s): AMMONIA in the last 168 hours. Coagulation Profile: No results for input(s): INR, PROTIME in the last 168 hours. Cardiac Enzymes: Recent Labs  Lab 06/05/17 2010  TROPONINI 0.50*   BNP (last 3 results) No results for input(s): PROBNP in the last 8760 hours. HbA1C: Recent Labs    06/05/17 1030  HGBA1C 6.2*   CBG: Recent Labs  Lab 06/06/17 2109 06/07/17 0817 06/07/17 1312 06/07/17 1744 06/07/17 2044  GLUCAP 98 113* 234* 96 133*   Lipid Profile: No results for input(s): CHOL, HDL, LDLCALC, TRIG, CHOLHDL,  LDLDIRECT in the last 72 hours. Thyroid Function Tests: No results for input(s): TSH, T4TOTAL, FREET4, T3FREE, THYROIDAB in the last 72 hours. Anemia Panel: No results for input(s): VITAMINB12, FOLATE, FERRITIN, TIBC, IRON, RETICCTPCT in the last 72 hours. Sepsis Labs: Recent Labs  Lab 06/05/17 0105 06/06/17 0957 06/07/17 0211  PROCALCITON  --  0.50 0.57  LATICACIDVEN 1.89 0.9  --     Recent Results (from the past 240 hour(s))  Blood Culture (routine x 2)     Status: Abnormal   Collection Time: 06/05/17 12:47 AM  Result Value Ref Range Status   Specimen Description BLOOD LEFT ANTECUBITAL  Final   Special Requests   Final    BOTTLES DRAWN AEROBIC AND ANAEROBIC Blood Culture adequate volume   Culture  Setup Time   Final    GRAM POSITIVE COCCI IN CHAINS IN BOTH AEROBIC AND ANAEROBIC BOTTLES CRITICAL RESULT CALLED TO, READ BACK BY AND VERIFIED WITH: PHARMD K PERKINS [id number] MLM    Culture VIRIDANS STREPTOCOCCUS (A)  Final   Report Status 06/07/2017 FINAL  Final   Organism ID, Bacteria VIRIDANS STREPTOCOCCUS  Final      Susceptibility   Viridans streptococcus - MIC*    ERYTHROMYCIN >=8 RESISTANT Resistant     TETRACYCLINE >=16 RESISTANT Resistant     VANCOMYCIN 0.5 SENSITIVE Sensitive     CLINDAMYCIN >=1 RESISTANT Resistant     PENICILLIN Value in next row Intermediate      INTERMEDIATE0.25     CEFTRIAXONE Value in next row Sensitive      SENSITIVE0.5    * VIRIDANS STREPTOCOCCUS  Blood Culture ID Panel (Reflexed)     Status: Abnormal   Collection Time: 06/05/17 12:47 AM  Result Value Ref Range Status   Enterococcus species NOT DETECTED NOT DETECTED Final   Listeria monocytogenes NOT DETECTED NOT DETECTED Final   Staphylococcus species NOT DETECTED NOT DETECTED Final   Staphylococcus aureus NOT DETECTED NOT DETECTED Final   Streptococcus species DETECTED (A) NOT DETECTED Final    Comment: Not Enterococcus species, Streptococcus agalactiae, Streptococcus pyogenes, or Streptococcus pneumoniae. CRITICAL RESULT CALLED TO, READ BACK BY AND VERIFIED WITH: PHARMD K PERKINS [id number] MLM    Streptococcus agalactiae NOT DETECTED NOT DETECTED Final   Streptococcus pneumoniae NOT DETECTED NOT DETECTED Final   Streptococcus pyogenes NOT DETECTED NOT DETECTED Final   Acinetobacter baumannii NOT DETECTED NOT DETECTED Final   Enterobacteriaceae species NOT DETECTED NOT DETECTED Final   Enterobacter cloacae complex NOT DETECTED NOT DETECTED Final   Escherichia coli NOT DETECTED NOT DETECTED Final   Klebsiella oxytoca NOT DETECTED NOT DETECTED Final   Klebsiella pneumoniae NOT DETECTED NOT DETECTED Final   Proteus species NOT DETECTED NOT DETECTED Final   Serratia marcescens NOT DETECTED NOT DETECTED Final   Haemophilus influenzae NOT DETECTED NOT DETECTED Final   Neisseria meningitidis NOT DETECTED NOT DETECTED Final   Pseudomonas aeruginosa NOT DETECTED NOT DETECTED Final   Candida albicans NOT DETECTED NOT DETECTED Final   Candida glabrata NOT DETECTED NOT DETECTED Final   Candida krusei NOT DETECTED NOT DETECTED Final   Candida parapsilosis NOT DETECTED NOT DETECTED Final   Candida tropicalis NOT DETECTED NOT DETECTED Final  Blood Culture (routine x 2)     Status: Abnormal   Collection Time: 06/05/17 12:52 AM  Result Value Ref Range Status   Specimen Description BLOOD RIGHT  ANTECUBITAL  Final   Special Requests   Final    BOTTLES DRAWN AEROBIC AND ANAEROBIC Blood Culture  adequate volume   Culture  Setup Time   Final    GRAM POSITIVE COCCI IN CHAINS IN BOTH AEROBIC AND ANAEROBIC BOTTLES CRITICAL VALUE NOTED.  VALUE IS CONSISTENT WITH PREVIOUSLY REPORTED AND CALLED VALUE.    Culture (A)  Final    VIRIDANS STREPTOCOCCUS SUSCEPTIBILITIES PERFORMED ON PREVIOUS CULTURE WITHIN THE LAST 5 DAYS.    Report Status 06/07/2017 FINAL  Final  Respiratory Panel by PCR     Status: None   Collection Time: 06/05/17  3:42 AM  Result Value Ref Range Status   Adenovirus NOT DETECTED NOT DETECTED Final   Coronavirus 229E NOT DETECTED NOT DETECTED Final   Coronavirus HKU1 NOT DETECTED NOT DETECTED Final   Coronavirus NL63 NOT DETECTED NOT DETECTED Final   Coronavirus OC43 NOT DETECTED NOT DETECTED Final   Metapneumovirus NOT DETECTED NOT DETECTED Final   Rhinovirus / Enterovirus NOT DETECTED NOT DETECTED Final   Influenza A NOT DETECTED NOT DETECTED Final   Influenza B NOT DETECTED NOT DETECTED Final   Parainfluenza Virus 1 NOT DETECTED NOT DETECTED Final   Parainfluenza Virus 2 NOT DETECTED NOT DETECTED Final   Parainfluenza Virus 3 NOT DETECTED NOT DETECTED Final   Parainfluenza Virus 4 NOT DETECTED NOT DETECTED Final   Respiratory Syncytial Virus NOT DETECTED NOT DETECTED Final   Bordetella pertussis NOT DETECTED NOT DETECTED Final   Chlamydophila pneumoniae NOT DETECTED NOT DETECTED Final   Mycoplasma pneumoniae NOT DETECTED NOT DETECTED Final  Culture, blood (routine x 2)     Status: None (Preliminary result)   Collection Time: 06/06/17  6:21 AM  Result Value Ref Range Status   Specimen Description BLOOD RIGHT ANTECUBITAL  Final   Special Requests IN PEDIATRIC BOTTLE Blood Culture adequate volume  Final   Culture NO GROWTH 1 DAY  Final   Report Status PENDING  Incomplete  Culture, blood (routine x 2)     Status: None (Preliminary result)   Collection Time:  06/06/17  6:28 AM  Result Value Ref Range Status   Specimen Description BLOOD LEFT ANTECUBITAL  Final   Special Requests IN PEDIATRIC BOTTLE Blood Culture adequate volume  Final   Culture NO GROWTH 1 DAY  Final   Report Status PENDING  Incomplete     Radiology Studies: Ct Head Wo Contrast  Result Date: 06/06/2017 CLINICAL DATA:  Altered level of consciousness. EXAM: CT HEAD WITHOUT CONTRAST TECHNIQUE: Contiguous axial images were obtained from the base of the skull through the vertex without intravenous contrast. COMPARISON:  CT 09/03/2005 FINDINGS: Brain: Marked progression of cerebral atrophy which is now moderate to advanced. Extensive white matter disease has developed since the prior study. This appears chronic. Negative for acute infarct, hemorrhage, or mass lesion. Vascular: Negative for hyperdense vessel Skull: Negative Sinuses/Orbits: Paranasal sinuses clear.  Bilateral lens replacement Other: None IMPRESSION: Marked progression of atrophy and chronic white matter ischemia. No acute abnormality. Electronically Signed   By: Marlan Palau M.D.   On: 06/06/2017 20:36    Scheduled Meds: . apixaban  2.5 mg Oral BID  . atorvastatin  40 mg Oral q1800  . brimonidine  1 drop Both Eyes BID   And  . timolol  1 drop Both Eyes BID  . digoxin  0.125 mg Oral Daily  . docusate sodium  100 mg Oral BID  . insulin aspart  0-9 Units Subcutaneous TID WC  . latanoprost  1 drop Both Eyes QHS  . lisinopril  20 mg Oral Daily  . mouth rinse  15 mL Mouth Rinse BID  . QUEtiapine  25 mg Oral QHS   Continuous Infusions: . sodium chloride Stopped (06/07/17 0830)  . azithromycin Stopped (06/07/17 1345)  . cefTRIAXone (ROCEPHIN)  IV Stopped (06/07/17 2335)     LOS: 3 days   Darlin Droparole N Emile Ringgenberg, MD [facility] Pager [phone]  If 7PM-7AM, please contact night-coverage [email or web address] Password [email or web address] 06/08/2017, 7:13 AM

## 2017-06-08 NOTE — Progress Notes (Signed)
  Echocardiogram 2D Echocardiogram has been performed.  Graysen Woodyard G Morey Andonian 06/08/2017, 10:09 AM

## 2017-06-09 DIAGNOSIS — F0281 Dementia in other diseases classified elsewhere with behavioral disturbance: Secondary | ICD-10-CM

## 2017-06-09 DIAGNOSIS — M21371 Foot drop, right foot: Secondary | ICD-10-CM

## 2017-06-09 DIAGNOSIS — I38 Endocarditis, valve unspecified: Secondary | ICD-10-CM

## 2017-06-09 DIAGNOSIS — B379 Candidiasis, unspecified: Secondary | ICD-10-CM

## 2017-06-09 DIAGNOSIS — B370 Candidal stomatitis: Secondary | ICD-10-CM

## 2017-06-09 DIAGNOSIS — I76 Septic arterial embolism: Secondary | ICD-10-CM

## 2017-06-09 DIAGNOSIS — Z7189 Other specified counseling: Secondary | ICD-10-CM

## 2017-06-09 DIAGNOSIS — Z952 Presence of prosthetic heart valve: Secondary | ICD-10-CM

## 2017-06-09 DIAGNOSIS — E785 Hyperlipidemia, unspecified: Secondary | ICD-10-CM

## 2017-06-09 DIAGNOSIS — Z79899 Other long term (current) drug therapy: Secondary | ICD-10-CM

## 2017-06-09 DIAGNOSIS — F02818 Dementia in other diseases classified elsewhere, unspecified severity, with other behavioral disturbance: Secondary | ICD-10-CM

## 2017-06-09 DIAGNOSIS — I10 Essential (primary) hypertension: Secondary | ICD-10-CM

## 2017-06-09 DIAGNOSIS — J189 Pneumonia, unspecified organism: Secondary | ICD-10-CM

## 2017-06-09 DIAGNOSIS — E7800 Pure hypercholesterolemia, unspecified: Secondary | ICD-10-CM

## 2017-06-09 DIAGNOSIS — J181 Lobar pneumonia, unspecified organism: Secondary | ICD-10-CM

## 2017-06-09 DIAGNOSIS — T826XXA Infection and inflammatory reaction due to cardiac valve prosthesis, initial encounter: Secondary | ICD-10-CM

## 2017-06-09 DIAGNOSIS — H9190 Unspecified hearing loss, unspecified ear: Secondary | ICD-10-CM

## 2017-06-09 DIAGNOSIS — G20 Parkinson's disease: Secondary | ICD-10-CM

## 2017-06-09 DIAGNOSIS — R6251 Failure to thrive (child): Secondary | ICD-10-CM

## 2017-06-09 LAB — CBC
HEMATOCRIT: 36.5 % (ref 36.0–46.0)
HEMOGLOBIN: 12.3 g/dL (ref 12.0–15.0)
MCH: 31.5 pg (ref 26.0–34.0)
MCHC: 33.7 g/dL (ref 30.0–36.0)
MCV: 93.4 fL (ref 78.0–100.0)
Platelets: 164 10*3/uL (ref 150–400)
RBC: 3.91 MIL/uL (ref 3.87–5.11)
RDW: 13.7 % (ref 11.5–15.5)
WBC: 12.5 10*3/uL — AB (ref 4.0–10.5)

## 2017-06-09 LAB — BASIC METABOLIC PANEL
ANION GAP: 7 (ref 5–15)
BUN: 11 mg/dL (ref 6–20)
CHLORIDE: 104 mmol/L (ref 101–111)
CO2: 22 mmol/L (ref 22–32)
Calcium: 7.8 mg/dL — ABNORMAL LOW (ref 8.9–10.3)
Creatinine, Ser: 0.59 mg/dL (ref 0.44–1.00)
GFR calc Af Amer: >60 mL/min (ref >60)
Glucose, Bld: 188 mg/dL — ABNORMAL HIGH (ref 65–99)
POTASSIUM: 3.8 mmol/L (ref 3.5–5.1)
SODIUM: 133 mmol/L — AB (ref 135–145)

## 2017-06-09 LAB — GLUCOSE, CAPILLARY
GLUCOSE-CAPILLARY: 164 mg/dL — AB (ref 65–99)
GLUCOSE-CAPILLARY: 132 mg/dL — AB (ref 65–99)
Glucose-Capillary: 114 mg/dL — ABNORMAL HIGH (ref 65–99)
Glucose-Capillary: 145 mg/dL — ABNORMAL HIGH (ref 65–99)

## 2017-06-09 MED ORDER — AMLODIPINE BESYLATE 10 MG PO TABS
10.0000 mg | ORAL_TABLET | Freq: Every day | ORAL | Status: DC
  Administered 2017-06-09 – 2017-06-10 (×2): 10 mg via ORAL
  Filled 2017-06-09 (×2): qty 1

## 2017-06-09 MED ORDER — DEXTROSE 5 % IV SOLN
2.0000 g | Freq: Two times a day (BID) | INTRAVENOUS | Status: DC
  Administered 2017-06-10 (×2): 2 g via INTRAVENOUS
  Filled 2017-06-09 (×3): qty 2

## 2017-06-09 MED ORDER — LATANOPROST 0.005 % OP SOLN: 1.0000 [drp] | Freq: Every day | OPHTHALMIC | Status: DC

## 2017-06-09 MED ORDER — FLUCONAZOLE 100MG IVPB
100.0000 mg | INTRAVENOUS | Status: DC
  Administered 2017-06-09: 100 mg via INTRAVENOUS
  Filled 2017-06-09 (×2): qty 50

## 2017-06-09 MED ORDER — DEXTROSE 5 % IV SOLN: 2.0000 g | INTRAVENOUS | Status: DC

## 2017-06-09 NOTE — Progress Notes (Addendum)
PROGRESS NOTE    Samantha Silva  ZOX:[id number] DOB: 31-May-1923 DOA: 06/05/2017 PCP: Patient, No Pcp Per    Brief Narrative:  84 y.o. female with medical history significant for open heart surgery, atrial fibrillation on eliquis, hypertension and hypercholesteremia. Recently moved to [place] from [place] to live with son here. Increasing confusion in the setting of dementia.  Upon presentation CAP, poa complicated by strep bacteremia. Repeat blood cx x 2 no growth 1 day (06/08/17).  No acute events overnight. Seen and examined with no family members at bedside. Alert not aggressive this morning in the setting of dementia with delirium. Denies pain. ID consulted 06/09/17 for strep viridans bacteremia in the setting of bioprosthetic valve.  Assessment & Plan:   Active Problems:   Lobar pneumonia (HCC)   Sepsis (HCC)  Active Problems:  Sepsis 2/2 to Lobar community acquired pneumonia, viridans strep bacteremia, present on admission, improving -CXR reviewed. Patient with R and central L sided airspace consolidation noted -on IV azithromycin and rocephin day # 3 -passed swallow evaluation by speech therapist -continue to wean O2 as tolerated  Viridians strep bacteremia, poa in the setting of bioprosthetic valve -sensitive to rocephin and vanc -2d echo in process -If 2d echo unremarkable, may benefit from TEE given hx of bioprothetic valve -repeat blood cx x2 no growth in 2 days -ID consulted  Atrial Fibrillation -rate controlled -continue eliquis for DVT ppx. -continue digoxin  Hypertension -Accelerated HTN -continue lisinopril- amlodipine 10 mg daily -Continue IV hydralazine 10 mg q6h prn for sbp>180 or dbp>105  Hypercholesterolemia --continue Lipitor as ordered  Likely Hx of Dementia with behavior disturbances -Family reports long history of progressive decline and memory loss -Increased agitation in hospital -Good results with trial of Seroquel at night -Continue  Haldol PRN -reorient as needed  Ambulatory dysfunction -PT to evaluate when more stable   DVT prophylaxis: Eliquis Code Status: Full Family Communication: No family member at bedside Disposition Plan: will stay another midnight to continue IV antibiotics  Consultants:   None  Procedures:  None  Antimicrobials: Anti-infectives (From admission, onward)   Start     Dose/Rate Route Frequency Ordered Stop   06/07/17 0800  vancomycin (VANCOCIN) IVPB 1000 mg/200 mL premix  Status:  Discontinued     1,000 mg 200 mL/hr over 60 Minutes Intravenous Every 24 hours 06/06/17 0659 06/07/17 1732   06/06/17 1000  cefTRIAXone (ROCEPHIN) 2 g in dextrose 5 % 50 mL IVPB     2 g 100 mL/hr over 30 Minutes Intravenous Every 12 hours 06/06/17 0654     06/06/17 0700  vancomycin (VANCOCIN) 1,500 mg in sodium chloride 0.9 % 500 mL IVPB     1,500 mg 250 mL/hr over 120 Minutes Intravenous  Once 06/06/17 0659 06/06/17 1256   06/06/17 0400  vancomycin (VANCOCIN) IVPB 1000 mg/200 mL premix  Status:  Discontinued     1,000 mg 200 mL/hr over 60 Minutes Intravenous Every 24 hours 06/05/17 0847 06/05/17 1148   06/05/17 2000  metroNIDAZOLE (FLAGYL) IVPB 500 mg  Status:  Discontinued     500 mg 100 mL/hr over 60 Minutes Intravenous Every 8 hours 06/05/17 1909 06/07/17 1730   06/05/17 1400  cefTRIAXone (ROCEPHIN) 1 g in dextrose 5 % 50 mL IVPB  Status:  Discontinued     1 g 100 mL/hr over 30 Minutes Intravenous Every 24 hours 06/05/17 1148 06/06/17 0654   06/05/17 1200  azithromycin (ZITHROMAX) 500 mg in dextrose 5 % 250 mL IVPB  500 mg 250 mL/hr over 60 Minutes Intravenous Every 24 hours 06/05/17 1124     06/05/17 1000  piperacillin-tazobactam (ZOSYN) IVPB 3.375 g  Status:  Discontinued     3.375 g 12.5 mL/hr over 240 Minutes Intravenous Every 8 hours 06/05/17 0847 06/05/17 1148   06/05/17 0300  vancomycin (VANCOCIN) IVPB 1000 mg/200 mL premix     1,000 mg 200 mL/hr over 60 Minutes Intravenous  Once  06/05/17 0257 06/05/17 0436   06/05/17 0300  piperacillin-tazobactam (ZOSYN) IVPB 3.375 g     3.375 g 12.5 mL/hr over 240 Minutes Intravenous  Once 06/05/17 0257 06/05/17 0732      Objective: Vitals:   06/08/17 2330 06/09/17 0000 06/09/17 0405 06/09/17 0801  BP: (!) 103/52 (!) 108/58 (!) 179/83 (!) 196/93  Pulse: 78 75 91 91  Resp: 17 16 19  (!) 21  Temp: 99 F (37.2 C)  97.9 F (36.6 C) 98.6 F (37 C)  TempSrc: Oral  Oral Axillary  SpO2: 95% 98% 96% 97%  Weight:      Height:        Intake/Output Summary (Last 24 hours) at 06/09/2017 0809 Last data filed at 06/09/2017 0400 Gross per 24 hour  Intake 584.17 ml  Output -  Net 584.17 ml   Filed Weights   06/05/17 0523  Weight: 62 kg (136 lb 11 oz)    Examination: General exam: 84 yo CF NAD confused and agressive Respiratory system: normal chest rise, clear, no audible wheezing Cardiovascular system: regular rhythm, s1-s2, systolic murmur Gastrointestinal system: Nondistended, nontender, pos BS Central nervous system: No seizures, no tremors Extremities: No cyanosis, no joint deformities Skin: No rashes, no pallor Psychiatry: unable to assess due to AMS   Data Reviewed: I have personally reviewed following labs and imaging studies  CBC: Recent Labs  Lab 06/05/17 0053 06/06/17 0622 06/07/17 0211 06/08/17 0759  WBC 17.7* 13.6* 12.1* 11.9*  NEUTROABS 15.4*  --   --   --   HGB 13.4 10.7* 11.2* 11.4*  HCT 39.8 32.0* 33.4* 34.2*  MCV 94.1 92.0 93.3 93.4  PLT 158 111* 139* 153   Basic Metabolic Panel: Recent Labs  Lab 06/05/17 0053 06/06/17 0622 06/07/17 0211 06/07/17 0759 06/08/17 0759  NA 132* 135 136  --  137  K 3.4* 2.7* 3.2*  --  3.8  CL 96* 99* 104  --  106  CO2 24 24 25   --  24  GLUCOSE 139* 127* 109*  --  132*  BUN 14 10 14   --  12  CREATININE 0.90 0.68 0.68  --  0.61  CALCIUM 8.5* 7.9* 7.8*  --  8.0*  MG  --   --   --  1.8  --    GFR: Estimated Creatinine Clearance: 43.6 mL/min (by C-G formula  based on SCr of 0.61 mg/dL). Liver Function Tests: Recent Labs  Lab 06/05/17 0053 06/06/17 0622 06/07/17 0211  AST 37 59* 38  ALT 27 27 24   ALKPHOS 92 73 66  BILITOT 1.8* 1.7* 1.2  PROT 6.8 6.3* 5.5*  ALBUMIN 3.5 2.7* 2.3*   No results for input(s): LIPASE, AMYLASE in the last 168 hours. No results for input(s): AMMONIA in the last 168 hours. Coagulation Profile: No results for input(s): INR, PROTIME in the last 168 hours. Cardiac Enzymes: Recent Labs  Lab 06/05/17 2010  TROPONINI 0.50*   BNP (last 3 results) No results for input(s): PROBNP in the last 8760 hours. HbA1C: No results for input(s): HGBA1C in  the last 72 hours. CBG: Recent Labs  Lab 06/07/17 2044 06/08/17 0829 06/08/17 1133 06/08/17 1734 06/08/17 2248  GLUCAP 133* 129* 193* 131* 147*   Lipid Profile: No results for input(s): CHOL, HDL, LDLCALC, TRIG, CHOLHDL, LDLDIRECT in the last 72 hours. Thyroid Function Tests: No results for input(s): TSH, T4TOTAL, FREET4, T3FREE, THYROIDAB in the last 72 hours. Anemia Panel: No results for input(s): VITAMINB12, FOLATE, FERRITIN, TIBC, IRON, RETICCTPCT in the last 72 hours. Sepsis Labs: Recent Labs  Lab 06/05/17 0105 06/06/17 0957 06/07/17 0211 06/08/17 0759  PROCALCITON  --  0.50 0.57 0.31  LATICACIDVEN 1.89 0.9  --   --     Recent Results (from the past 240 hour(s))  Blood Culture (routine x 2)     Status: Abnormal   Collection Time: 06/05/17 12:47 AM  Result Value Ref Range Status   Specimen Description BLOOD LEFT ANTECUBITAL  Final   Special Requests   Final    BOTTLES DRAWN AEROBIC AND ANAEROBIC Blood Culture adequate volume   Culture  Setup Time   Final    GRAM POSITIVE COCCI IN CHAINS IN BOTH AEROBIC AND ANAEROBIC BOTTLES CRITICAL RESULT CALLED TO, READ BACK BY AND VERIFIED WITH: PHARMD K PERKINS [id number] MLM    Culture VIRIDANS STREPTOCOCCUS (A)  Final   Report Status 06/07/2017 FINAL  Final   Organism ID, Bacteria VIRIDANS STREPTOCOCCUS   Final      Susceptibility   Viridans streptococcus - MIC*    ERYTHROMYCIN >=8 RESISTANT Resistant     TETRACYCLINE >=16 RESISTANT Resistant     VANCOMYCIN 0.5 SENSITIVE Sensitive     CLINDAMYCIN >=1 RESISTANT Resistant     PENICILLIN Value in next row Intermediate      INTERMEDIATE0.25    CEFTRIAXONE Value in next row Sensitive      SENSITIVE0.5    * VIRIDANS STREPTOCOCCUS  Blood Culture ID Panel (Reflexed)     Status: Abnormal   Collection Time: 06/05/17 12:47 AM  Result Value Ref Range Status   Enterococcus species NOT DETECTED NOT DETECTED Final   Listeria monocytogenes NOT DETECTED NOT DETECTED Final   Staphylococcus species NOT DETECTED NOT DETECTED Final   Staphylococcus aureus NOT DETECTED NOT DETECTED Final   Streptococcus species DETECTED (A) NOT DETECTED Final    Comment: Not Enterococcus species, Streptococcus agalactiae, Streptococcus pyogenes, or Streptococcus pneumoniae. CRITICAL RESULT CALLED TO, READ BACK BY AND VERIFIED WITH: PHARMD K PERKINS [id number] MLM    Streptococcus agalactiae NOT DETECTED NOT DETECTED Final   Streptococcus pneumoniae NOT DETECTED NOT DETECTED Final   Streptococcus pyogenes NOT DETECTED NOT DETECTED Final   Acinetobacter baumannii NOT DETECTED NOT DETECTED Final   Enterobacteriaceae species NOT DETECTED NOT DETECTED Final   Enterobacter cloacae complex NOT DETECTED NOT DETECTED Final   Escherichia coli NOT DETECTED NOT DETECTED Final   Klebsiella oxytoca NOT DETECTED NOT DETECTED Final   Klebsiella pneumoniae NOT DETECTED NOT DETECTED Final   Proteus species NOT DETECTED NOT DETECTED Final   Serratia marcescens NOT DETECTED NOT DETECTED Final   Haemophilus influenzae NOT DETECTED NOT DETECTED Final   Neisseria meningitidis NOT DETECTED NOT DETECTED Final   Pseudomonas aeruginosa NOT DETECTED NOT DETECTED Final   Candida albicans NOT DETECTED NOT DETECTED Final   Candida glabrata NOT DETECTED NOT DETECTED Final   Candida krusei NOT  DETECTED NOT DETECTED Final   Candida parapsilosis NOT DETECTED NOT DETECTED Final   Candida tropicalis NOT DETECTED NOT DETECTED Final  Blood Culture (routine x 2)  Status: Abnormal   Collection Time: 06/05/17 12:52 AM  Result Value Ref Range Status   Specimen Description BLOOD RIGHT ANTECUBITAL  Final   Special Requests   Final    BOTTLES DRAWN AEROBIC AND ANAEROBIC Blood Culture adequate volume   Culture  Setup Time   Final    GRAM POSITIVE COCCI IN CHAINS IN BOTH AEROBIC AND ANAEROBIC BOTTLES CRITICAL VALUE NOTED.  VALUE IS CONSISTENT WITH PREVIOUSLY REPORTED AND CALLED VALUE.    Culture (A)  Final    VIRIDANS STREPTOCOCCUS SUSCEPTIBILITIES PERFORMED ON PREVIOUS CULTURE WITHIN THE LAST 5 DAYS.    Report Status 06/07/2017 FINAL  Final  Respiratory Panel by PCR     Status: None   Collection Time: 06/05/17  3:42 AM  Result Value Ref Range Status   Adenovirus NOT DETECTED NOT DETECTED Final   Coronavirus 229E NOT DETECTED NOT DETECTED Final   Coronavirus HKU1 NOT DETECTED NOT DETECTED Final   Coronavirus NL63 NOT DETECTED NOT DETECTED Final   Coronavirus OC43 NOT DETECTED NOT DETECTED Final   Metapneumovirus NOT DETECTED NOT DETECTED Final   Rhinovirus / Enterovirus NOT DETECTED NOT DETECTED Final   Influenza A NOT DETECTED NOT DETECTED Final   Influenza B NOT DETECTED NOT DETECTED Final   Parainfluenza Virus 1 NOT DETECTED NOT DETECTED Final   Parainfluenza Virus 2 NOT DETECTED NOT DETECTED Final   Parainfluenza Virus 3 NOT DETECTED NOT DETECTED Final   Parainfluenza Virus 4 NOT DETECTED NOT DETECTED Final   Respiratory Syncytial Virus NOT DETECTED NOT DETECTED Final   Bordetella pertussis NOT DETECTED NOT DETECTED Final   Chlamydophila pneumoniae NOT DETECTED NOT DETECTED Final   Mycoplasma pneumoniae NOT DETECTED NOT DETECTED Final  Culture, blood (routine x 2)     Status: None (Preliminary result)   Collection Time: 06/06/17  6:21 AM  Result Value Ref Range Status    Specimen Description BLOOD RIGHT ANTECUBITAL  Final   Special Requests IN PEDIATRIC BOTTLE Blood Culture adequate volume  Final   Culture NO GROWTH 2 DAYS  Final   Report Status PENDING  Incomplete  Culture, blood (routine x 2)     Status: None (Preliminary result)   Collection Time: 06/06/17  6:28 AM  Result Value Ref Range Status   Specimen Description BLOOD LEFT ANTECUBITAL  Final   Special Requests IN PEDIATRIC BOTTLE Blood Culture adequate volume  Final   Culture NO GROWTH 2 DAYS  Final   Report Status PENDING  Incomplete     Radiology Studies: No results found.  Scheduled Meds: . amLODipine  10 mg Oral Daily  . apixaban  2.5 mg Oral BID  . atorvastatin  40 mg Oral q1800  . brimonidine  1 drop Both Eyes BID   And  . timolol  1 drop Both Eyes BID  . digoxin  0.125 mg Oral Daily  . docusate sodium  100 mg Oral BID  . insulin aspart  0-9 Units Subcutaneous TID WC  . latanoprost  1 drop Both Eyes QHS  . lisinopril  20 mg Oral Daily  . mouth rinse  15 mL Mouth Rinse BID  . QUEtiapine  25 mg Oral QHS   Continuous Infusions: . sodium chloride 50 mL/hr at 06/09/17 0400  . azithromycin Stopped (06/08/17 1434)  . cefTRIAXone (ROCEPHIN)  IV 2 g (06/08/17 2130)     LOS: 4 days   Darlin Drop, MD [facility] Pager [phone]  If 7PM-7AM, please contact night-coverage [email or web address] Password [email or web address] 06/09/2017, 8:09 AM

## 2017-06-09 NOTE — Progress Notes (Signed)
Spoke with patient's son Samantha NewcomerChris Art- he is requesting an in person meeting tomorrow- planning on meeting them at 11AM to discuss goals of care.  Samantha MaltaElizabeth Golding, DO Palliative Medicine

## 2017-06-09 NOTE — Progress Notes (Signed)
Pharmacy Antibiotic Note  Samantha Silva is a 84 y.o. 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  -WBC= 12.5, afebrile   Plan: -Change rocephin to 2gm IV q24h -Will follow renal function, cultures and clinical progress   Height: 5\' 7"  (170.2 cm) Weight: 136 lb 11 oz (62 kg) IBW/kg (Calculated) : 61.6  Temp (24hrs), Avg:98.4 F (36.9 C), Min:97.5 F (36.4 C), Max:99 F (37.2 C)  Recent Labs  Lab 06/05/17 0053 06/05/17 0105 06/06/17 0622 06/06/17 0957 06/07/17 0211 06/08/17 0759 06/09/17 0812 06/09/17 1247  WBC 17.7*  --  13.6*  --  12.1* 11.9* 12.5*  --   CREATININE 0.90  --  0.68  --  0.68 0.61  --  0.59  LATICACIDVEN  --  1.89  --  0.9  --   --   --   --     Estimated Creatinine Clearance: 43.6 mL/min (by C-G formula based on SCr of 0.59 mg/dL).    No Known Allergies   Thank you for allowing pharmacy to be a part of this patient's care.  Harland GermanAndrew Shannan Silva, Pharm D 06/09/2017 2:42 PM

## 2017-06-09 NOTE — Progress Notes (Signed)
Pharmacy Antibiotic Note  Shanna CiscoDolores Peatross is a 84 y.o. 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 Spoke with ID and plan is to maintain rocephin with concern of possible brain abscess.  -WBC= 12.5, afebrile   Plan: -Continue rocephin to 2gm IV q24h -Will follow renal function, cultures and clinical progress   Height: 5\' 7"  (170.2 cm) Weight: 136 lb 11 oz (62 kg) IBW/kg (Calculated) : 61.6  Temp (24hrs), Avg:98.4 F (36.9 C), Min:97.5 F (36.4 C), Max:99 F (37.2 C)  Recent Labs  Lab 06/05/17 0053 06/05/17 0105 06/06/17 0622 06/06/17 0957 06/07/17 0211 06/08/17 0759 06/09/17 0812 06/09/17 1247  WBC 17.7*  --  13.6*  --  12.1* 11.9* 12.5*  --   CREATININE 0.90  --  0.68  --  0.68 0.61  --  0.59  LATICACIDVEN  --  1.89  --  0.9  --   --   --   --     Estimated Creatinine Clearance: 43.6 mL/min (by C-G formula based on SCr of 0.59 mg/dL).    No Known Allergies   Thank you for allowing pharmacy to be a part of this patient's care.  Harland GermanAndrew Chidinma Clites, Pharm D 06/09/2017 2:50 PM

## 2017-06-09 NOTE — Consult Note (Signed)
Date of Admission:  06/05/2017          Reason for Consult: Viridans streptococcal endocarditis and likely PVE    Referring Provider: Dr. Margo AyeHall   Assessment: 1.  Viridans group streptococcal bacteremia 2.  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  Right leg weakness concerning for ischemic versus embolic stroke 4.  Poor dentition 5.  Dementia 6.  Hearing loss with hearing aids in place 7.  Dementia with behavioral disturbance 8.  Atrial fibrillation 9.  Failure to thrive 10.  Delirium 11. Thrush  Plan: 1. Ok to continue Ceftriaxone but I suspect she will need combination of HIGH DOSE PCN and GENTAMICIN x 6 weeks 2. Discuss with Cardiology TEE 3. Obtain MRI brain with contrast to look for embolic CVA, brain abscess 4. CT of [place] facial area to look for odontogenic source of bacteremia 5. I would also strongly consider Palliative care consult--and I have called Dr. Julaine FusiBeth Golding 6. Fluconazole for thrush  Dr. Orvan Falconerampbell will be taking over the service tomorrow  Active Problems:   Lobar pneumonia (HCC)   Sepsis (HCC)   Scheduled Meds: . amLODipine  10 mg Oral Daily  . apixaban  2.5 mg Oral BID  . atorvastatin  40 mg Oral q1800  . brimonidine  1 drop Both Eyes BID   And  . timolol  1 drop Both Eyes BID  . digoxin  0.125 mg Oral Daily  . docusate sodium  100 mg Oral BID  . insulin aspart  0-9 Units Subcutaneous TID WC  . latanoprost  1 drop Both Eyes QHS  . lisinopril  20 mg Oral Daily  . mouth rinse  15 mL Mouth Rinse BID  . QUEtiapine  25 mg Oral QHS   Continuous Infusions: . sodium chloride 50 mL/hr at 06/09/17 0400  . azithromycin 500 mg (06/09/17 1204)  . cefTRIAXone (ROCEPHIN)  IV Stopped (06/09/17 1055)   PRN Meds:.acetaminophen, bisacodyl, haloperidol lactate, hydrALAZINE, HYDROcodone-acetaminophen, ipratropium-albuterol, ondansetron (ZOFRAN) IV  HPI: Samantha Silva is a 84 y.o. female with hx of bioprosthetic heart valve (not clear  which one but I believe it might be aortic), HTN, Hyperlipidemia, Atrial fibrillation who had living in [place] with her daughters who has developed a steady decline over the last several months.  Along the way she developed a problem with her foot and was walking with a walker while dragging the foot behind her.  This was apparently worked up at a hospital in [place] with a CT scan but the cause for this is not known.  It is not known whether she had a stroke or a spinal cord problem.  She also has been developing progressive dementia and had at times become combative in the context of infection such as urinary tract infections.  Became too difficult for her daughters to manage and they were trying to have her placed in a skilled nursing facility.  This was not desired by her son who went up to [place] and brought her down to [place] to live here.  In the interim she has continued to have a steady decline in her functioning.  She then became abruptly worse at the end of December becoming progressively confused.  She again became angry and was trying to stand up without assistance the day prior to admission.  She began to have a low-grade temperature which went as high as 103 degrees.  She was brought via EMS to the emergency department  and apparently was combative with EMS staff on route.  Blood cultures were done in the emergency department and a chest x-ray obtained showed bilateral infiltrates concerning for either pneumonia or pulmonary edema and a left-sided pleural effusion.  She was started on ceftriaxone and azithromycin her blood cultures have subsequently come back with viridans group streptococci with an intermediate susceptibility to penicillin.  Her repeat blood cultures are without growth.  He has had a 2D echocardiogram read of which is not visible in epic but which reportedly showed no evidence of vegetation.  Spite the findings on the 2D echocardiogram I have a very high  clinical suspicion for prosthetic valve endocarditis. By Duke's criteria she has ONE major = VGS in blood, at least 2 minor with fever and predisposing condition with PV and fever she is only one minor criteria away from "Definite Endocarditis" by Duke's criteria.  I am also worried about her Neuro deficit and would consider an MRI of the brain with contrast to look for septic emboli, brain abscess.  I am very skeptical she will be able to get through the textbook 6 weeks of high dose IV PCN and Gentamicin without her having adverse event for ex from AG, pulling PICC line out or other complication.  I am also concerned about risk of TEE in a frail patient whose trajectory appears poor.  Even an MRI brain will not be trivial  I have had extensive sedation with her son Thayer Ohm and strongly recommended a palliative care consult.  He agrees to this and says that he and his family members are all "on board with trying to make the quality of their mother's life the focus rather than the duration of her life.  I have put in a phone call to Julaine Fusi who will come see the patient today.  I would not recommend proceeding with a TEE or even the MRI until discussions have occurred between palliative care and the family and the patient.  I spent greater than 110 minutes with the patient including greater than 50% of time in face to face counsel of the patient and the daughter in law, telephonic conversation with the son Thayer Ohm re her diagnosis of likely endocarditis, nature of VGS, risks benefits to treatments, studies we might undertake goals of care  and in coordination of her care at the bedside with Dr. Margo Aye and over telephone with Dr. Phillips Odor.  Dr. Orvan Falconer will take over the service tomorrow.     Review of Systems: Review of Systems  Unable to perform ROS: Dementia    Past Medical History:  Diagnosis Date  . High cholesterol   . Hypertension     Social History   Tobacco Use  . Smoking  status: Never Smoker  . Smokeless tobacco: Never Used  Substance Use Topics  . Alcohol use: Not on file  . Drug use: Not on file    History reviewed. No pertinent family history. No Known Allergies  OBJECTIVE: Blood pressure (!) 157/88, pulse 88, temperature (!) 97.5 F (36.4 C), temperature source Oral, resp. rate (!) 25, height 5\' 7"  (1.702 m), weight 136 lb 11 oz (62 kg), SpO2 95 %.  Physical Exam  Constitutional: She has a sickly appearance.  HENT:  Head: Normocephalic. Head is with left periorbital erythema.  Mouth/Throat: Abnormal dentition.    Chipped teeth, soft tissue abnormality on soft palate (benign)  Cardiovascular: Normal rate. An irregularly irregular rhythm present.  Murmur heard.  Systolic murmur is present with a  grade of 2/6. At LUSB  Pulmonary/Chest: Effort normal and breath sounds normal. No respiratory distress. She has no wheezes. She has no rales. She exhibits no tenderness.  Abdominal: Soft. Bowel sounds are normal. She exhibits no distension and no mass. There is no tenderness. There is no rebound and no guarding.  Musculoskeletal: Normal range of motion.  Neurological: No cranial nerve deficit.  Skin: Skin is warm and dry.     Psychiatric:  Confused, sleepy but arousable and follows commands   Abrasion on left heel  06/09/17:    No Janeways or Osler's  Lab Results Lab Results  Component Value Date   WBC 12.5 (H) 06/09/2017   HGB 12.3 06/09/2017   HCT 36.5 06/09/2017   MCV 93.4 06/09/2017   PLT 164 06/09/2017    Lab Results  Component Value Date   CREATININE 0.61 06/08/2017   BUN 12 06/08/2017   NA 137 06/08/2017   K 3.8 06/08/2017   CL 106 06/08/2017   CO2 24 06/08/2017    Lab Results  Component Value Date   ALT 24 06/07/2017   AST 38 06/07/2017   ALKPHOS 66 06/07/2017   BILITOT 1.2 06/07/2017     Microbiology: Recent Results (from the past 240 hour(s))  Blood Culture (routine x 2)     Status: Abnormal   Collection  Time: 06/05/17 12:47 AM  Result Value Ref Range Status   Specimen Description BLOOD LEFT ANTECUBITAL  Final   Special Requests   Final    BOTTLES DRAWN AEROBIC AND ANAEROBIC Blood Culture adequate volume   Culture  Setup Time   Final    GRAM POSITIVE COCCI IN CHAINS IN BOTH AEROBIC AND ANAEROBIC BOTTLES CRITICAL RESULT CALLED TO, READ BACK BY AND VERIFIED WITH: PHARMD K PERKINS [id number] MLM    Culture VIRIDANS STREPTOCOCCUS (A)  Final   Report Status 06/07/2017 FINAL  Final   Organism ID, Bacteria VIRIDANS STREPTOCOCCUS  Final      Susceptibility   Viridans streptococcus - MIC*    ERYTHROMYCIN >=8 RESISTANT Resistant     TETRACYCLINE >=16 RESISTANT Resistant     VANCOMYCIN 0.5 SENSITIVE Sensitive     CLINDAMYCIN >=1 RESISTANT Resistant     PENICILLIN Value in next row Intermediate      INTERMEDIATE0.25    CEFTRIAXONE Value in next row Sensitive      SENSITIVE0.5    * VIRIDANS STREPTOCOCCUS  Blood Culture ID Panel (Reflexed)     Status: Abnormal   Collection Time: 06/05/17 12:47 AM  Result Value Ref Range Status   Enterococcus species NOT DETECTED NOT DETECTED Final   Listeria monocytogenes NOT DETECTED NOT DETECTED Final   Staphylococcus species NOT DETECTED NOT DETECTED Final   Staphylococcus aureus NOT DETECTED NOT DETECTED Final   Streptococcus species DETECTED (A) NOT DETECTED Final    Comment: Not Enterococcus species, Streptococcus agalactiae, Streptococcus pyogenes, or Streptococcus pneumoniae. CRITICAL RESULT CALLED TO, READ BACK BY AND VERIFIED WITH: PHARMD K PERKINS [id number] MLM    Streptococcus agalactiae NOT DETECTED NOT DETECTED Final   Streptococcus pneumoniae NOT DETECTED NOT DETECTED Final   Streptococcus pyogenes NOT DETECTED NOT DETECTED Final   Acinetobacter baumannii NOT DETECTED NOT DETECTED Final   Enterobacteriaceae species NOT DETECTED NOT DETECTED Final   Enterobacter cloacae complex NOT DETECTED NOT DETECTED Final   Escherichia coli NOT  DETECTED NOT DETECTED Final   Klebsiella oxytoca NOT DETECTED NOT DETECTED Final   Klebsiella pneumoniae NOT DETECTED NOT DETECTED Final  Proteus species NOT DETECTED NOT DETECTED Final   Serratia marcescens NOT DETECTED NOT DETECTED Final   Haemophilus influenzae NOT DETECTED NOT DETECTED Final   Neisseria meningitidis NOT DETECTED NOT DETECTED Final   Pseudomonas aeruginosa NOT DETECTED NOT DETECTED Final   Candida albicans NOT DETECTED NOT DETECTED Final   Candida glabrata NOT DETECTED NOT DETECTED Final   Candida krusei NOT DETECTED NOT DETECTED Final   Candida parapsilosis NOT DETECTED NOT DETECTED Final   Candida tropicalis NOT DETECTED NOT DETECTED Final  Blood Culture (routine x 2)     Status: Abnormal   Collection Time: 06/05/17 12:52 AM  Result Value Ref Range Status   Specimen Description BLOOD RIGHT ANTECUBITAL  Final   Special Requests   Final    BOTTLES DRAWN AEROBIC AND ANAEROBIC Blood Culture adequate volume   Culture  Setup Time   Final    GRAM POSITIVE COCCI IN CHAINS IN BOTH AEROBIC AND ANAEROBIC BOTTLES CRITICAL VALUE NOTED.  VALUE IS CONSISTENT WITH PREVIOUSLY REPORTED AND CALLED VALUE.    Culture (A)  Final    VIRIDANS STREPTOCOCCUS SUSCEPTIBILITIES PERFORMED ON PREVIOUS CULTURE WITHIN THE LAST 5 DAYS.    Report Status 06/07/2017 FINAL  Final  Respiratory Panel by PCR     Status: None   Collection Time: 06/05/17  3:42 AM  Result Value Ref Range Status   Adenovirus NOT DETECTED NOT DETECTED Final   Coronavirus 229E NOT DETECTED NOT DETECTED Final   Coronavirus HKU1 NOT DETECTED NOT DETECTED Final   Coronavirus NL63 NOT DETECTED NOT DETECTED Final   Coronavirus OC43 NOT DETECTED NOT DETECTED Final   Metapneumovirus NOT DETECTED NOT DETECTED Final   Rhinovirus / Enterovirus NOT DETECTED NOT DETECTED Final   Influenza A NOT DETECTED NOT DETECTED Final   Influenza B NOT DETECTED NOT DETECTED Final   Parainfluenza Virus 1 NOT DETECTED NOT DETECTED Final    Parainfluenza Virus 2 NOT DETECTED NOT DETECTED Final   Parainfluenza Virus 3 NOT DETECTED NOT DETECTED Final   Parainfluenza Virus 4 NOT DETECTED NOT DETECTED Final   Respiratory Syncytial Virus NOT DETECTED NOT DETECTED Final   Bordetella pertussis NOT DETECTED NOT DETECTED Final   Chlamydophila pneumoniae NOT DETECTED NOT DETECTED Final   Mycoplasma pneumoniae NOT DETECTED NOT DETECTED Final  Culture, blood (routine x 2)     Status: None (Preliminary result)   Collection Time: 06/06/17  6:21 AM  Result Value Ref Range Status   Specimen Description BLOOD RIGHT ANTECUBITAL  Final   Special Requests IN PEDIATRIC BOTTLE Blood Culture adequate volume  Final   Culture NO GROWTH 2 DAYS  Final   Report Status PENDING  Incomplete  Culture, blood (routine x 2)     Status: None (Preliminary result)   Collection Time: 06/06/17  6:28 AM  Result Value Ref Range Status   Specimen Description BLOOD LEFT ANTECUBITAL  Final   Special Requests IN PEDIATRIC BOTTLE Blood Culture adequate volume  Final   Culture NO GROWTH 2 DAYS  Final   Report Status PENDING  Incomplete    Acey Lav, MD [facility] [phone] pager   [phone] cell 06/09/2017, 12:55 PM

## 2017-06-10 DIAGNOSIS — F0281 Dementia in other diseases classified elsewhere with behavioral disturbance: Secondary | ICD-10-CM

## 2017-06-10 DIAGNOSIS — B370 Candidal stomatitis: Secondary | ICD-10-CM

## 2017-06-10 DIAGNOSIS — J181 Lobar pneumonia, unspecified organism: Secondary | ICD-10-CM

## 2017-06-10 DIAGNOSIS — M21371 Foot drop, right foot: Secondary | ICD-10-CM

## 2017-06-10 DIAGNOSIS — A409 Streptococcal sepsis, unspecified: Secondary | ICD-10-CM

## 2017-06-10 DIAGNOSIS — Z7189 Other specified counseling: Secondary | ICD-10-CM

## 2017-06-10 DIAGNOSIS — T826XXA Infection and inflammatory reaction due to cardiac valve prosthesis, initial encounter: Secondary | ICD-10-CM

## 2017-06-10 DIAGNOSIS — I2690 Septic pulmonary embolism without acute cor pulmonale: Secondary | ICD-10-CM

## 2017-06-10 DIAGNOSIS — R627 Adult failure to thrive: Secondary | ICD-10-CM

## 2017-06-10 LAB — GLUCOSE, CAPILLARY
Glucose-Capillary: 132 mg/dL — ABNORMAL HIGH (ref 65–99)
Glucose-Capillary: 120 mg/dL — ABNORMAL HIGH (ref 65–99)
Glucose-Capillary: 166 mg/dL — ABNORMAL HIGH (ref 65–99)

## 2017-06-10 LAB — HEPATIC FUNCTION PANEL
ALK PHOS: 75 U/L (ref 38–126)
ALT: 24 U/L (ref 14–54)
AST: 29 U/L (ref 15–41)
Albumin: 2.1 g/dL — ABNORMAL LOW (ref 3.5–5.0)
BILIRUBIN TOTAL: 0.6 mg/dL (ref 0.3–1.2)
Total Protein: 5.3 g/dL — ABNORMAL LOW (ref 6.5–8.1)

## 2017-06-10 MED ORDER — MORPHINE SULFATE (CONCENTRATE) 10 MG/0.5ML PO SOLN: 10.0000 mg | ORAL | 0 refills | Status: AC | PRN

## 2017-06-10 MED ORDER — OLANZAPINE 5 MG PO TBDP
10.0000 mg | ORAL_TABLET | Freq: Every day | ORAL | Status: DC
  Administered 2017-06-10: 10 mg via ORAL
  Filled 2017-06-10: qty 2

## 2017-06-10 MED ORDER — HALOPERIDOL LACTATE 2 MG/ML PO CONC: 2.0000 mg | ORAL | 0 refills | Status: AC | PRN

## 2017-06-10 MED ORDER — MORPHINE SULFATE (CONCENTRATE) 10 MG/0.5ML PO SOLN: 10.0000 mg | ORAL | Status: DC | PRN

## 2017-06-10 MED ORDER — LORAZEPAM 2 MG/ML PO CONC: 1.0000 mg | ORAL | 0 refills | Status: AC | PRN

## 2017-06-10 MED ORDER — HALOPERIDOL LACTATE 2 MG/ML PO CONC: 2.0000 mg | ORAL | Status: DC | PRN

## 2017-06-10 MED ORDER — MORPHINE SULFATE (CONCENTRATE) 10 MG/0.5ML PO SOLN
10.0000 mg | Freq: Four times a day (QID) | ORAL | Status: DC
  Administered 2017-06-10 (×2): 10 mg via SUBLINGUAL
  Filled 2017-06-10 (×2): qty 0.5

## 2017-06-10 MED ORDER — LORAZEPAM 2 MG/ML PO CONC
1.0000 mg | ORAL | Status: DC | PRN
  Administered 2017-06-10: 1 mg via ORAL
  Filled 2017-06-10: qty 1

## 2017-06-10 NOTE — Progress Notes (Addendum)
PROGRESS NOTE    Samantha Silva  EMV:[id number] DOB: 01-19-1923 DOA: 06/05/2017 PCP: Patient, No Pcp Per    Brief Narrative:  84 y.o. female with medical history significant for open heart surgery, atrial fibrillation on eliquis, hypertension and hypercholesteremia. Recently moved to [place] from [place] to live with son here. Increasing confusion in the setting of dementia.  Upon presentation CAP, poa complicated by strep bacteremia. Repeat blood cx x 2 no growth 1 day (06/08/17).  Aggressive behavior reported overnight. Back on 2 point soft restraints. Seen and examined with no family members at bedside. Alert but confused in the setting of dementia. Does not appear in distress. Palliative care team consulted and met with family for goals of care.  Assessment & Plan:   Active Problems:   Lobar pneumonia ([place])   Sepsis ([place])   Community acquired pneumonia of right lower lobe of lung ([place])   Prosthetic valve endocarditis ([place])   Septic embolism ([place])   Thrush   Dementia associated with other underlying disease with behavioral disturbance   Right foot drop   Failure to thrive (0-17)   Goals of care, counseling/discussion  Active Problems: Sepsis 2/2 to L[place] community acquired pneumonia, viridans strep bacteremia, present on admission -mild improvement clinically  -CXR reviewed. Patient with R and central L sided airspace consolidation noted -on IV rocephin day # 4; completed 3 days of IV azithromycin -duonebs q6h prn for wheezing -ID consulted for strep viridans bacteremia -passed swallow evaluation by speech therapist -wean O2 as tolerated -close monitoring of vital signs  Viridians strep bacteremia, poa in the setting of bioprosthetic aortic valve -sensitive to rocephin and vanc -2d echo inconclusive -Ordered TEE which is on hold due to the family wanting to discuss goal of care -repeat blood cx x2 no growth in 3 days -ID following. We appreciate recommendations -Hold  off TEE until goals of care are discussed  Chronic Atrial Fibrillation -rate controlled -continue eliquis for DVT ppx. -continue digoxin  Hypertension -Accelerated HTN -continue lisinopril- amlodipine 10 mg daily -Continue IV hydralazine 10 mg q6h prn for sbp>180 or dbp>105  Oral thrush, poa -IV fluconazole day#1 -oral care  Hypercholesterolemia -continue Lipitor as ordered  Likely Hx of Dementia with behavior disturbances -Family reports long history of progressive decline and memory loss -Increased agitation in hospital -Good results with trial of Seroquel at night -Continue Haldol PRN -reorient as needed  Ambulatory dysfunction -PT to evaluate when more stable -recent fall prior to admission  Goals of care  -Patient's family met with palliative care team today 06/10/17 to discuss goals of care. -Patient is now Comfort care only   DVT prophylaxis: Eliquis Code Status: Full Family Communication: No family members at bedside Disposition Plan: will stay another midnight to continue IV antibiotics  Consultants:   None  Procedures:  None  Antimicrobials: Anti-infectives (From admission, onward)   Start     Dose/Rate Route Frequency Ordered Stop   06/10/17 0904  cefTRIAXone (ROCEPHIN) 2 g in dextrose 5 % 50 mL IVPB  Status:  Discontinued     2 g 100 mL/hr over 30 Minutes Intravenous Every 24 hours 06/09/17 1446 06/09/17 1450   06/09/17 2200  cefTRIAXone (ROCEPHIN) 2 g in dextrose 5 % 50 mL IVPB     2 g 100 mL/hr over 30 Minutes Intravenous Every 12 hours 06/09/17 1449     06/09/17 1430  fluconazole (DIFLUCAN) IVPB 100 mg     100 mg 50 mL/hr over 60 Minutes Intravenous Every 24  hours 06/09/17 1343     06/07/17 0800  vancomycin (VANCOCIN) IVPB 1000 mg/200 mL premix  Status:  Discontinued     1,000 mg 200 mL/hr over 60 Minutes Intravenous Every 24 hours 06/06/17 0659 06/07/17 1732   06/06/17 1000  cefTRIAXone (ROCEPHIN) 2 g in dextrose 5 % 50 mL IVPB  Status:   Discontinued     2 g 100 mL/hr over 30 Minutes Intravenous Every 12 hours 06/06/17 0654 06/09/17 1446   06/06/17 0700  vancomycin (VANCOCIN) 1,500 mg in sodium chloride 0.9 % 500 mL IVPB     1,500 mg 250 mL/hr over 120 Minutes Intravenous  Once 06/06/17 0659 06/06/17 1256   06/06/17 0400  vancomycin (VANCOCIN) IVPB 1000 mg/200 mL premix  Status:  Discontinued     1,000 mg 200 mL/hr over 60 Minutes Intravenous Every 24 hours 06/05/17 0847 06/05/17 1148   06/05/17 2000  metroNIDAZOLE (FLAGYL) IVPB 500 mg  Status:  Discontinued     500 mg 100 mL/hr over 60 Minutes Intravenous Every 8 hours 06/05/17 1909 06/07/17 1730   06/05/17 1400  cefTRIAXone (ROCEPHIN) 1 g in dextrose 5 % 50 mL IVPB  Status:  Discontinued     1 g 100 mL/hr over 30 Minutes Intravenous Every 24 hours 06/05/17 1148 06/06/17 0654   06/05/17 1200  azithromycin (ZITHROMAX) 500 mg in dextrose 5 % 250 mL IVPB  Status:  Discontinued     500 mg 250 mL/hr over 60 Minutes Intravenous Every 24 hours 06/05/17 1124 06/09/17 1343   06/05/17 1000  piperacillin-tazobactam (ZOSYN) IVPB 3.375 g  Status:  Discontinued     3.375 g 12.5 mL/hr over 240 Minutes Intravenous Every 8 hours 06/05/17 0847 06/05/17 1148   06/05/17 0300  vancomycin (VANCOCIN) IVPB 1000 mg/200 mL premix     1,000 mg 200 mL/hr over 60 Minutes Intravenous  Once 06/05/17 0257 06/05/17 0436   06/05/17 0300  piperacillin-tazobactam (ZOSYN) IVPB 3.375 g     3.375 g 12.5 mL/hr over 240 Minutes Intravenous  Once 06/05/17 0257 06/05/17 0732      Objective: Vitals:   06/09/17 1929 06/09/17 2309 06/10/17 0423 06/10/17 0731  BP: (!) 177/89 (!) 166/116 (!) 166/93 (!) 165/91  Pulse: 94 (!) 119 94 99  Resp: (!) 23 (!) _0 Temp: 98.4 F (36.9 C)  97.9 F (36.6 C) (!) 97.4 F (36.3 C)  TempSrc: Oral  Axillary Oral  SpO2: 93% 99% 97% 95%  Weight:      Height:        Intake/Output Summary (Last 24 hours) at 06/10/2017 8676 Last data filed at 06/10/2017 0008 Gross per  24 hour  Intake 1440 ml  Output -  Net 1440 ml   Filed Weights   06/05/17 0523  Weight: 62 kg (136 lb 11 oz)    Examination: General exam: 84 yo CF NAD alert but confused in the setting of dementia Respiratory system: normal chest rise, clear, no audible wheezing. Oral thrush which is improving, poa Cardiovascular system: regular rhythm, P9-J0, systolic murmur Gastrointestinal system: Nondistended, nontender, pos BS Central nervous system: No seizures, no tremors Extremities: No cyanosis, no joint deformities Skin: No rashes, no pallor Psychiatry: unable to assess due to AMS   Data Reviewed: I have personally reviewed following labs and imaging studies  CBC: Recent Labs  Lab 06/05/17 0053 06/06/17 0622 06/07/17 0211 06/08/17 0759 06/09/17 0812  WBC 17.7* 13.6* 12.1* 11.9* 12.5*  NEUTROABS 15.4*  --   --   --   --  HGB 13.4 10.7* 11.2* 11.4* 12.3  HCT 39.8 32.0* 33.4* 34.2* 36.5  MCV 94.1 92.0 93.3 93.4 93.4  PLT 158 111* 139* 153 706   Basic Metabolic Panel: Recent Labs  Lab 06/05/17 0053 06/06/17 0622 06/07/17 0211 06/07/17 0759 06/08/17 0759 06/09/17 1247  NA 132* 135 136  --  137 133*  K 3.4* 2.7* 3.2*  --  3.8 3.8  CL 96* 99* 104  --  106 104  CO2 _0 --  24 22  GLUCOSE 139* 127* 109*  --  132* 188*  BUN _1 --  12 11  CREATININE 0.90 0.68 0.68  --  0.61 0.59  CALCIUM 8.5* 7.9* 7.8*  --  8.0* 7.8*  MG  --   --   --  1.8  --   --    GFR: Estimated Creatinine Clearance: 43.6 mL/min (by C-G formula based on SCr of 0.59 mg/dL). Liver Function Tests: Recent Labs  Lab 06/05/17 0053 06/06/17 0622 06/07/17 0211 06/10/17 0231  AST 37 59* 38 29  ALT _2 ALKPHOS 92 73 66 75  BILITOT 1.8* 1.7* 1.2 0.6  PROT 6.8 6.3* 5.5* 5.3*  ALBUMIN 3.5 2.7* 2.3* 2.1*   No results for input(s): LIPASE, AMYLASE in the last 168 hours. No results for input(s): AMMONIA in the last 168 hours. Coagulation Profile: No results for input(s): INR,  PROTIME in the last 168 hours. Cardiac Enzymes: Recent Labs  Lab 06/05/17 2010  TROPONINI 0.50*   BNP (last 3 results) No results for input(s): PROBNP in the last 8760 hours. HbA1C: No results for input(s): HGBA1C in the last 72 hours. CBG: Recent Labs  Lab 06/09/17 0827 06/09/17 1155 06/09/17 1703 06/09/17 2050 06/10/17 0814  GLUCAP 114* 145* 164* 132* 132*   Lipid Profile: No results for input(s): CHOL, HDL, LDLCALC, TRIG, CHOLHDL, LDLDIRECT in the last 72 hours. Thyroid Function Tests: No results for input(s): TSH, T4TOTAL, FREET4, T3FREE, THYROIDAB in the last 72 hours. Anemia Panel: No results for input(s): VITAMINB12, FOLATE, FERRITIN, TIBC, IRON, RETICCTPCT in the last 72 hours. Sepsis Labs: Recent Labs  Lab 06/05/17 0105 06/06/17 0957 06/07/17 0211 06/08/17 0759  PROCALCITON  --  0.50 0.57 0.31  LATICACIDVEN 1.89 0.9  --   --     Recent Results (from the past 240 hour(s))  Blood Culture (routine x 2)     Status: Abnormal   Collection Time: 06/05/17 12:47 AM  Result Value Ref Range Status   Specimen Description BLOOD LEFT ANTECUBITAL  Final   Special Requests   Final    BOTTLES DRAWN AEROBIC AND ANAEROBIC Blood Culture adequate volume   Culture  Setup Time   Final    GRAM POSITIVE COCCI IN CHAINS IN BOTH AEROBIC AND ANAEROBIC BOTTLES CRITICAL RESULT CALLED TO, READ BACK BY AND VERIFIED WITH: PHARMD K PERKINS [id number] MLM    Culture VIRIDANS STREPTOCOCCUS (A)  Final   Report Status 06/07/2017 FINAL  Final   Organism ID, Bacteria VIRIDANS STREPTOCOCCUS  Final      Susceptibility   Viridans streptococcus - MIC*    ERYTHROMYCIN >=8 RESISTANT Resistant     TETRACYCLINE >=16 RESISTANT Resistant     VANCOMYCIN 0.5 SENSITIVE Sensitive     CLINDAMYCIN >=1 RESISTANT Resistant     PENICILLIN Value in next row Intermediate      INTERMEDIATE0.25    CEFTRIAXONE Value in next row Sensitive      SENSITIVE0.5    *  VIRIDANS STREPTOCOCCUS  Blood Culture ID Panel  (Reflexed)     Status: Abnormal   Collection Time: 06/05/17 12:47 AM  Result Value Ref Range Status   Enterococcus species NOT DETECTED NOT DETECTED Final   Listeria monocytogenes NOT DETECTED NOT DETECTED Final   Staphylococcus species NOT DETECTED NOT DETECTED Final   Staphylococcus aureus NOT DETECTED NOT DETECTED Final   Streptococcus species DETECTED (A) NOT DETECTED Final    Comment: Not Enterococcus species, Streptococcus agalactiae, Streptococcus pyogenes, or Streptococcus pneumoniae. CRITICAL RESULT CALLED TO, READ BACK BY AND VERIFIED WITH: PHARMD K PERKINS [id number] MLM    Streptococcus agalactiae NOT DETECTED NOT DETECTED Final   Streptococcus pneumoniae NOT DETECTED NOT DETECTED Final   Streptococcus pyogenes NOT DETECTED NOT DETECTED Final   Acinetobacter baumannii NOT DETECTED NOT DETECTED Final   Enterobacteriaceae species NOT DETECTED NOT DETECTED Final   Enterobacter cloacae complex NOT DETECTED NOT DETECTED Final   Escherichia coli NOT DETECTED NOT DETECTED Final   Klebsiella oxytoca NOT DETECTED NOT DETECTED Final   Klebsiella pneumoniae NOT DETECTED NOT DETECTED Final   Proteus species NOT DETECTED NOT DETECTED Final   Serratia marcescens NOT DETECTED NOT DETECTED Final   Haemophilus influenzae NOT DETECTED NOT DETECTED Final   Neisseria meningitidis NOT DETECTED NOT DETECTED Final   Pseudomonas aeruginosa NOT DETECTED NOT DETECTED Final   Candida albicans NOT DETECTED NOT DETECTED Final   Candida glabrata NOT DETECTED NOT DETECTED Final   Candida krusei NOT DETECTED NOT DETECTED Final   Candida parapsilosis NOT DETECTED NOT DETECTED Final   Candida tropicalis NOT DETECTED NOT DETECTED Final  Blood Culture (routine x 2)     Status: Abnormal   Collection Time: 06/05/17 12:52 AM  Result Value Ref Range Status   Specimen Description BLOOD RIGHT ANTECUBITAL  Final   Special Requests   Final    BOTTLES DRAWN AEROBIC AND ANAEROBIC Blood Culture adequate volume    Culture  Setup Time   Final    GRAM POSITIVE COCCI IN CHAINS IN BOTH AEROBIC AND ANAEROBIC BOTTLES CRITICAL VALUE NOTED.  VALUE IS CONSISTENT WITH PREVIOUSLY REPORTED AND CALLED VALUE.    Culture (A)  Final    VIRIDANS STREPTOCOCCUS SUSCEPTIBILITIES PERFORMED ON PREVIOUS CULTURE WITHIN THE LAST 5 DAYS.    Report Status 06/07/2017 FINAL  Final  Respiratory Panel by PCR     Status: None   Collection Time: 06/05/17  3:42 AM  Result Value Ref Range Status   Adenovirus NOT DETECTED NOT DETECTED Final   Coronavirus 229E NOT DETECTED NOT DETECTED Final   Coronavirus HKU1 NOT DETECTED NOT DETECTED Final   Coronavirus NL63 NOT DETECTED NOT DETECTED Final   Coronavirus OC43 NOT DETECTED NOT DETECTED Final   Metapneumovirus NOT DETECTED NOT DETECTED Final   Rhinovirus / Enterovirus NOT DETECTED NOT DETECTED Final   Influenza A NOT DETECTED NOT DETECTED Final   Influenza B NOT DETECTED NOT DETECTED Final   Parainfluenza Virus 1 NOT DETECTED NOT DETECTED Final   Parainfluenza Virus 2 NOT DETECTED NOT DETECTED Final   Parainfluenza Virus 3 NOT DETECTED NOT DETECTED Final   Parainfluenza Virus 4 NOT DETECTED NOT DETECTED Final   Respiratory Syncytial Virus NOT DETECTED NOT DETECTED Final   Bordetella pertussis NOT DETECTED NOT DETECTED Final   Chlamydophila pneumoniae NOT DETECTED NOT DETECTED Final   Mycoplasma pneumoniae NOT DETECTED NOT DETECTED Final  Culture, blood (routine x 2)     Status: None (Preliminary result)   Collection Time: 06/06/17  6:21  AM  Result Value Ref Range Status   Specimen Description BLOOD RIGHT ANTECUBITAL  Final   Special Requests IN PEDIATRIC BOTTLE Blood Culture adequate volume  Final   Culture NO GROWTH 3 DAYS  Final   Report Status PENDING  Incomplete  Culture, blood (routine x 2)     Status: None (Preliminary result)   Collection Time: 06/06/17  6:28 AM  Result Value Ref Range Status   Specimen Description BLOOD LEFT ANTECUBITAL  Final   Special Requests IN  PEDIATRIC BOTTLE Blood Culture adequate volume  Final   Culture NO GROWTH 3 DAYS  Final   Report Status PENDING  Incomplete     Radiology Studies: No results found.  Scheduled Meds: . amLODipine  10 mg Oral Daily  . apixaban  2.5 mg Oral BID  . brimonidine  1 drop Both Eyes BID   And  . timolol  1 drop Both Eyes BID  . digoxin  0.125 mg Oral Daily  . docusate sodium  100 mg Oral BID  . insulin aspart  0-9 Units Subcutaneous TID WC  . latanoprost  1 drop Both Eyes QHS  . lisinopril  20 mg Oral Daily  . mouth rinse  15 mL Mouth Rinse BID  . QUEtiapine  25 mg Oral QHS   Continuous Infusions: . sodium chloride 50 mL/hr at 06/09/17 0400  . cefTRIAXone (ROCEPHIN)  IV Stopped (06/10/17 0038)  . fluconazole (DIFLUCAN) IV Stopped (06/09/17 1600)     LOS: 5 days   Kayleen Memos, MD [facility] Pager [phone]  If 7PM-7AM, please contact night-coverage [email or web address] Password [email or web address] 06/10/2017, 9:03 AM

## 2017-06-10 NOTE — Discharge Summary (Addendum)
Discharge Summary  Samantha Silva LPF:[id number] DOB: 31-Dec-1922  PCP: Patient, No Pcp Per  Admit date: 06/05/2017 Discharge date: 06/10/2017  Time spent: 25 minutes   Recommendations for Outpatient Follow-up:  1. Comfort care only as requested by the patient's direct family members.  Discharge Diagnoses:  Active Hospital Problems   Diagnosis Date Noted  . Community acquired pneumonia of right lower lobe of lung ([place])   . Prosthetic valve endocarditis ([place])   . Septic embolism ([place])   . Thrush   . Dementia associated with other underlying disease with behavioral disturbance   . Right foot drop   . Failure to thrive (0-17)   . Goals of care, counseling/discussion   . Lobar pneumonia ([place]) 06/05/2017  . Sepsis ([place]) 06/05/2017    Resolved Hospital Problems  No resolved problems to display.    Discharge Condition: Unstable   Diet recommendation: As tolerated  Vitals:   06/10/17 1009 06/10/17 1100  BP: (!) 143/94 (!) 88/55  Pulse:  83  Resp:  16  Temp:    SpO2:  100%    History of present illness:  84 y.o.femalewith medical history significant foropen heart surgery, atrial fibrillation on eliquis, hypertension and hypercholesteremia. Recently moved to [place] from [place] to live with son here. Increasing confusion in the setting of dementia. Upon presentation CAP, poa complicated by strep bacteremia. Repeat blood cx x 2 no growth 1 day (06/08/17).  Aggressive behavior reported overnight of 06/10/17. Back on 2 point soft restraints. Seen and examined at bedside. Alert but confused in the setting of dementia. Does not appear in distress. Palliative care team consulted and met with family for goals of care. Family made patient comfort care only today 06/10/17.   Hospital Course:  Active Problems:   Lobar pneumonia ([place])   Sepsis ([place])   Community acquired pneumonia of right lower lobe of lung ([place])   Prosthetic valve endocarditis ([facility])   Septic embolism ([place])  Thrush   Dementia associated with other underlying disease with behavioral disturbance   Right foot drop   Failure to thrive (0-17)   Goals of care, counseling/discussion  PATIENT WAS MADE COMFORT CARE ONLY BY DIRECT FAMILY MEMBERS TODAY 06/10/17. ALL TREATMENTS DIRECTED TO PROLONG LIFE DISCONTINUED.  Sepsis 2/2 to Lobar community acquired pneumonia, viridans strep bacteremia, present on admission -minimal improvement clinically  -CXR reviewed. Patient with R and central L sided airspace consolidation noted -on IV rocephin day # 4; completed 3 days of IV azithromycin -duonebs q6h prn for wheezing -ID consulted for strep viridans bacteremia -passed swallow evaluation by speech therapist -wean O2 as tolerated -close monitoring of vital signs  Viridians strep bacteremia, poa in the setting of bioprosthetic aortic valve -sensitive to rocephin and vanc -2d echo inconclusive -Ordered TEE which is on hold due to the family wanting to discuss goal of care -repeat blood cx x2 no growth in 3 days -ID following. We appreciate recommendations -Hold off TEE until goals of care are discussed  Delirium due to dementia -Reorient -Soft restraints when pulling IV as needed  Chronic Atrial Fibrillation -rate controlled -eliquis for DVT ppx. -digoxin  Hypertension -Accelerated HTN -lisinopril- amlodipine 10 mg daily -IV hydralazine 10 mg q6h prn for sbp>180 or dbp>105  Oral thrush, poa -IV fluconazole day#1 -oral care  Hypercholesterolemia -Lipitor  Likely Hx of Dementia with behavior disturbances -Family reports long history of progressive decline and memory loss -Increased agitation in hospital -Good results with trial of Seroquel at night -Haldol PRN -reorient as  needed  Ambulatory dysfunction -PT to evaluate when more stable -recent fall prior to admission  Goals of care  -Patient's family met with palliative care team today 06/10/17 to discuss goals of care. -Patient  made comfort care only per direct family members.   Procedures:  none  Consultations:  ID  Palliative care team  Discharge Exam: BP (!) 88/55 (BP Location: Left Arm)   Pulse 83   Temp (!) 97.4 F (36.3 C) (Oral)   Resp 16   Ht _0  (1.702 m)   Wt 62 kg (136 lb 11 oz)   SpO2 100%   BMI 21.41 kg/m   General exam: 84 yo CF NAD alert but confused in the setting of dementia Respiratory system: normal chest rise, clear, no audible wheezing. Oral thrush which is improving, poa Cardiovascular system: regular rhythm, Z0-S9, systolic murmur Gastrointestinal system: Nondistended, nontender, pos BS Central nervous system: No seizures, no tremors Extremities: No cyanosis, no joint deformities Skin: No rashes, no pallor Psychiatry: unable to assess due to AMS  Discharge Instructions You were cared for by a hospitalist during your hospital stay. If you have any questions about your discharge medications or the care you received while you were in the hospital after you are discharged, you can call the unit and asked to speak with the hospitalist on call if the hospitalist that took care of you is not available. Once you are discharged, your primary care physician will handle any further medical issues. Please note that NO REFILLS for any discharge medications will be authorized once you are discharged, as it is imperative that you return to your primary care physician (or establish a relationship with a primary care physician if you do not have one) for your aftercare needs so that they can reassess your need for medications and monitor your lab values.   Allergies as of 06/10/2017   No Known Allergies     Medication List    STOP taking these medications   atorvastatin 40 MG tablet Commonly known as:  LIPITOR   bisacodyl 5 MG EC tablet Commonly known as:  DULCOLAX   digoxin 0.125 MG tablet Commonly known as:  LANOXIN   ELIQUIS 2.5 MG Tabs tablet Generic drug:  apixaban     latanoprost 0.005 % ophthalmic solution Commonly known as:  XALATAN   lisinopril 20 MG tablet Commonly known as:  PRINIVIL,ZESTRIL   metFORMIN 500 MG 24 hr tablet Commonly known as:  GLUCOPHAGE-XR     TAKE these medications   COMBIGAN 0.2-0.5 % ophthalmic solution Generic drug:  brimonidine-timolol Place 1 drop into both eyes every 12 (twelve) hours.   haloperidol 2 MG/ML solution Commonly known as:  HALDOL Take 1 mL (2 mg total) by mouth every 4 (four) hours as needed for agitation.   LORazepam 2 MG/ML concentrated solution Commonly known as:  ATIVAN Take 0.5 mLs (1 mg total) by mouth every 4 (four) hours as needed for anxiety, seizure, sedation or sleep.   morphine CONCENTRATE 10 MG/0.5ML Soln concentrated solution Place 0.5 mLs (10 mg total) under the tongue every 2 (two) hours as needed for moderate pain, severe pain, anxiety or shortness of breath.      No Known Allergies    The results of significant diagnostics from this hospitalization (including imaging, microbiology, ancillary and laboratory) are listed below for reference.    Significant Diagnostic Studies: Ct Head Wo Contrast  Result Date: 06/06/2017 CLINICAL DATA:  Altered level of consciousness. EXAM: CT HEAD WITHOUT CONTRAST  TECHNIQUE: Contiguous axial images were obtained from the base of the skull through the vertex without intravenous contrast. COMPARISON:  CT 09/03/2005 FINDINGS: Brain: Marked progression of cerebral atrophy which is now moderate to advanced. Extensive white matter disease has developed since the prior study. This appears chronic. Negative for acute infarct, hemorrhage, or mass lesion. Vascular: Negative for hyperdense vessel Skull: Negative Sinuses/Orbits: Paranasal sinuses clear.  Bilateral lens replacement Other: None IMPRESSION: Marked progression of atrophy and chronic white matter ischemia. No acute abnormality. Electronically Signed   By: Franchot Gallo M.D.   On: 06/06/2017 20:36    Dg Chest Port 1 View  Result Date: 06/05/2017 CLINICAL DATA:  Acute onset of fever.  Altered mental status. EXAM: PORTABLE CHEST 1 VIEW COMPARISON:  None. FINDINGS: The lungs are well-aerated. Diffuse right-sided and central left-sided airspace opacification may reflect pneumonia or asymmetric interstitial edema. A small left pleural effusion is suspected. No pneumothorax is seen. The cardiomediastinal silhouette is borderline normal in size. The patient is status post median sternotomy. No acute osseous abnormalities are seen. IMPRESSION: Diffuse right-sided and central left-sided airspace opacification may reflect pneumonia or asymmetric interstitial edema. Suspect small left pleural effusion. Electronically Signed   By: Garald Balding M.D.   On: 06/05/2017 01:09    Microbiology: Recent Results (from the past 240 hour(s))  Blood Culture (routine x 2)     Status: Abnormal   Collection Time: 06/05/17 12:47 AM  Result Value Ref Range Status   Specimen Description BLOOD LEFT ANTECUBITAL  Final   Special Requests   Final    BOTTLES DRAWN AEROBIC AND ANAEROBIC Blood Culture adequate volume   Culture  Setup Time   Final    GRAM POSITIVE COCCI IN CHAINS IN BOTH AEROBIC AND ANAEROBIC BOTTLES CRITICAL RESULT CALLED TO, READ BACK BY AND VERIFIED WITH: PHARMD K PERKINS [id number] MLM    Culture VIRIDANS STREPTOCOCCUS (A)  Final   Report Status 06/07/2017 FINAL  Final   Organism ID, Bacteria VIRIDANS STREPTOCOCCUS  Final      Susceptibility   Viridans streptococcus - MIC*    ERYTHROMYCIN >=8 RESISTANT Resistant     TETRACYCLINE >=16 RESISTANT Resistant     VANCOMYCIN 0.5 SENSITIVE Sensitive     CLINDAMYCIN >=1 RESISTANT Resistant     PENICILLIN Value in next row Intermediate      INTERMEDIATE0.25    CEFTRIAXONE Value in next row Sensitive      SENSITIVE0.5    * VIRIDANS STREPTOCOCCUS  Blood Culture ID Panel (Reflexed)     Status: Abnormal   Collection Time: 06/05/17 12:47 AM  Result  Value Ref Range Status   Enterococcus species NOT DETECTED NOT DETECTED Final   Listeria monocytogenes NOT DETECTED NOT DETECTED Final   Staphylococcus species NOT DETECTED NOT DETECTED Final   Staphylococcus aureus NOT DETECTED NOT DETECTED Final   Streptococcus species DETECTED (A) NOT DETECTED Final    Comment: Not Enterococcus species, Streptococcus agalactiae, Streptococcus pyogenes, or Streptococcus pneumoniae. CRITICAL RESULT CALLED TO, READ BACK BY AND VERIFIED WITH: PHARMD K PERKINS [id number] MLM    Streptococcus agalactiae NOT DETECTED NOT DETECTED Final   Streptococcus pneumoniae NOT DETECTED NOT DETECTED Final   Streptococcus pyogenes NOT DETECTED NOT DETECTED Final   Acinetobacter baumannii NOT DETECTED NOT DETECTED Final   Enterobacteriaceae species NOT DETECTED NOT DETECTED Final   Enterobacter cloacae complex NOT DETECTED NOT DETECTED Final   Escherichia coli NOT DETECTED NOT DETECTED Final   Klebsiella oxytoca NOT DETECTED NOT DETECTED Final  Klebsiella pneumoniae NOT DETECTED NOT DETECTED Final   Proteus species NOT DETECTED NOT DETECTED Final   Serratia marcescens NOT DETECTED NOT DETECTED Final   Haemophilus influenzae NOT DETECTED NOT DETECTED Final   Neisseria meningitidis NOT DETECTED NOT DETECTED Final   Pseudomonas aeruginosa NOT DETECTED NOT DETECTED Final   Candida albicans NOT DETECTED NOT DETECTED Final   Candida glabrata NOT DETECTED NOT DETECTED Final   Candida krusei NOT DETECTED NOT DETECTED Final   Candida parapsilosis NOT DETECTED NOT DETECTED Final   Candida tropicalis NOT DETECTED NOT DETECTED Final  Blood Culture (routine x 2)     Status: Abnormal   Collection Time: 06/05/17 12:52 AM  Result Value Ref Range Status   Specimen Description BLOOD RIGHT ANTECUBITAL  Final   Special Requests   Final    BOTTLES DRAWN AEROBIC AND ANAEROBIC Blood Culture adequate volume   Culture  Setup Time   Final    GRAM POSITIVE COCCI IN CHAINS IN BOTH AEROBIC  AND ANAEROBIC BOTTLES CRITICAL VALUE NOTED.  VALUE IS CONSISTENT WITH PREVIOUSLY REPORTED AND CALLED VALUE.    Culture (A)  Final    VIRIDANS STREPTOCOCCUS SUSCEPTIBILITIES PERFORMED ON PREVIOUS CULTURE WITHIN THE LAST 5 DAYS.    Report Status 06/07/2017 FINAL  Final  Respiratory Panel by PCR     Status: None   Collection Time: 06/05/17  3:42 AM  Result Value Ref Range Status   Adenovirus NOT DETECTED NOT DETECTED Final   Coronavirus 229E NOT DETECTED NOT DETECTED Final   Coronavirus HKU1 NOT DETECTED NOT DETECTED Final   Coronavirus NL63 NOT DETECTED NOT DETECTED Final   Coronavirus OC43 NOT DETECTED NOT DETECTED Final   Metapneumovirus NOT DETECTED NOT DETECTED Final   Rhinovirus / Enterovirus NOT DETECTED NOT DETECTED Final   Influenza A NOT DETECTED NOT DETECTED Final   Influenza B NOT DETECTED NOT DETECTED Final   Parainfluenza Virus 1 NOT DETECTED NOT DETECTED Final   Parainfluenza Virus 2 NOT DETECTED NOT DETECTED Final   Parainfluenza Virus 3 NOT DETECTED NOT DETECTED Final   Parainfluenza Virus 4 NOT DETECTED NOT DETECTED Final   Respiratory Syncytial Virus NOT DETECTED NOT DETECTED Final   Bordetella pertussis NOT DETECTED NOT DETECTED Final   Chlamydophila pneumoniae NOT DETECTED NOT DETECTED Final   Mycoplasma pneumoniae NOT DETECTED NOT DETECTED Final  Culture, blood (routine x 2)     Status: None (Preliminary result)   Collection Time: 06/06/17  6:21 AM  Result Value Ref Range Status   Specimen Description BLOOD RIGHT ANTECUBITAL  Final   Special Requests IN PEDIATRIC BOTTLE Blood Culture adequate volume  Final   Culture NO GROWTH 4 DAYS  Final   Report Status PENDING  Incomplete  Culture, blood (routine x 2)     Status: None (Preliminary result)   Collection Time: 06/06/17  6:28 AM  Result Value Ref Range Status   Specimen Description BLOOD LEFT ANTECUBITAL  Final   Special Requests IN PEDIATRIC BOTTLE Blood Culture adequate volume  Final   Culture NO GROWTH 4  DAYS  Final   Report Status PENDING  Incomplete  Culture, blood (routine x 2)     Status: None (Preliminary result)   Collection Time: 06/09/17 12:47 PM  Result Value Ref Range Status   Specimen Description BLOOD LEFT WRIST  Final   Special Requests IN PEDIATRIC BOTTLE Blood Culture adequate volume  Final   Culture NO GROWTH 1 DAY  Final   Report Status PENDING  Incomplete  Culture,  blood (routine x 2)     Status: None (Preliminary result)   Collection Time: 06/09/17 12:50 PM  Result Value Ref Range Status   Specimen Description BLOOD LEFT HAND  Final   Special Requests IN PEDIATRIC BOTTLE Blood Culture adequate volume  Final   Culture NO GROWTH 1 DAY  Final   Report Status PENDING  Incomplete     Labs: Basic Metabolic Panel: Recent Labs  Lab 06/05/17 0053 06/06/17 0622 06/07/17 0211 06/07/17 0759 06/08/17 0759 06/09/17 1247  NA 132* 135 136  --  137 133*  K 3.4* 2.7* 3.2*  --  3.8 3.8  CL 96* 99* 104  --  106 104  CO2 _0 --  24 22  GLUCOSE 139* 127* 109*  --  132* 188*  BUN _1 --  12 11  CREATININE 0.90 0.68 0.68  --  0.61 0.59  CALCIUM 8.5* 7.9* 7.8*  --  8.0* 7.8*  MG  --   --   --  1.8  --   --    Liver Function Tests: Recent Labs  Lab 06/05/17 0053 06/06/17 0622 06/07/17 0211 06/10/17 0231  AST 37 59* 38 29  ALT _2 ALKPHOS 92 73 66 75  BILITOT 1.8* 1.7* 1.2 0.6  PROT 6.8 6.3* 5.5* 5.3*  ALBUMIN 3.5 2.7* 2.3* 2.1*   No results for input(s): LIPASE, AMYLASE in the last 168 hours. No results for input(s): AMMONIA in the last 168 hours. CBC: Recent Labs  Lab 06/05/17 0053 06/06/17 0622 06/07/17 0211 06/08/17 0759 06/09/17 0812  WBC 17.7* 13.6* 12.1* 11.9* 12.5*  NEUTROABS 15.4*  --   --   --   --   HGB 13.4 10.7* 11.2* 11.4* 12.3  HCT 39.8 32.0* 33.4* 34.2* 36.5  MCV 94.1 92.0 93.3 93.4 93.4  PLT 158 111* 139* 153 164   Cardiac Enzymes: Recent Labs  Lab 06/05/17 2010  TROPONINI 0.50*   BNP: BNP (last 3 results) No  results for input(s): BNP in the last 8760 hours.  ProBNP (last 3 results) No results for input(s): PROBNP in the last 8760 hours.  CBG: Recent Labs  Lab 06/09/17 1155 06/09/17 1703 06/09/17 2050 06/10/17 0814 06/10/17 1231  GLUCAP 145* 164* 132* 132* 120*       Signed:  Kayleen Memos, MD [facility] 06/10/2017, 3:02 PM

## 2017-06-10 NOTE — Care Management Note (Addendum)
Case Management Note  Patient Details  Name: Shanna CiscoDolores Leard MRN: [id number] Date of Birth: 04/13/1923  Subjective/Objective:                    Action/Plan:   PTA from home.  Pt has been seen by Palliative and the decision has been made for the pt to go home with hospice.  Family given choice by Palliative NP - [facility].  Agency contacted and referral given, required documentation faxed to agency at [phone] - agency will review documentation, send nurse to perform assessment and follow back up with CM.  Agency made aware that family would like for pt to discharged home today.  Pt will need to utilize [facility] for transport home. CM will continue to follow for discharge needs   Expected Discharge Date:                  Expected Discharge Plan:  Home w Hospice Care  In-House Referral:     Discharge planning Services  CM Consult  Post Acute Care Choice:    Choice offered to:     DME Arranged:    DME Agency:     HH Arranged:    HH Agency[facility]  Status of Service:  In process, will continue to follow  If discussed at Long Length of Stay Meetings, dates discussed:    Additional Comments: 06/10/2017  Discharge summary faxed to agency.  CM confirmed with [facility] that pt can be transported to zip code without payment up front.  Discharge packet given to bedside nurse - daughter-in-law will inform bedside nurse when equipment is delivered  Update;  [facility] - plan on admitting pt tonight once she arrives home.  Equipment is scheduled to arrive in the home between 6 and 10pm - it has been requested that pt not leave facility until all equipment has been delivered including oxygen and hospital bed - information communicated to bedside nurse.  Pt will transport home via [facility] - bedside nurse to call agency once equipment is confirmed in the home. At agency request - paper prescriptions for pain/comfort medications.  Discharge  summary will need to be faxed to [phone], family has been instructed by agency to contact them when pt is about to be transported home.  Cherylann ParrClaxton, Levorn Oleski S, RN 06/10/2017, 12:14 PM

## 2017-06-10 NOTE — Progress Notes (Signed)
[facility] at bedside to transport pt to home.  Pt daughter and granddaughter at bedside and aware of transport.  Discharge instructions given to daughter and signed.   Office managerTransport package given to EMS.  No questions or concerns voiced at this time.  Will discharge pt to home with hospice care via EMS.

## 2017-06-10 NOTE — Progress Notes (Signed)
Nutrition Brief Note  RD consulted for assessment of nutritional status/ needs. Chart reviewed. Pt now transitioning to comfort care.  No further nutrition interventions warranted at this time.  Please re-consult as needed.   Kendryck Lacroix A. Mayford KnifeWilliams, RD, LDN, CDE Pager: [phone] After hours Pager: [phone]

## 2017-06-10 NOTE — Consult Note (Signed)
Palliative Care Consult Reason: GOC, symptom management Req by: Dr. Tommy Medal   84 yo woman from [facility], recently moved in with her son in [place] admitted with strep virdans bacteremia and bioprothestic valve, vascular dementia and PNA. Declining rapidly. Met with her son, daughter in law who are her primary caregivers. She had six children, 4 are living and involved in her care but live in [place], [place] and [place].   We discussed the serious nature of her infection, PNA and complicated vascular dementia with severe delirium. At this time they do not wish for aggressive interventions and want comfort to be the main goal now.   She had a living will and we reviewed this-comfort care in in line with her previously stated wishes.  1. DNR 2. Full Comfort Care 3. Discontinue all medications not related to comfort  Discharge Medications:  1. Roxanol 55m SubLingual q6 hours scheduled and q2 PRN - dispense 340mconcentration should be 2059ml.  2. Haldol 2mg83mlution q4 PRN agitation 3. Scheduled PM Zyprexa dissolve tablet 10mg68mAtivan concentrate 2mg/m9mive 1mg q435mn for severe agitation  Ok to continue heart meds and even eloquis if she is able to swallow and awake- otherwise hold and discontinue them.  We discussed comfort feeding, no artifical hydration or force feeding.  NO RESTRAINTS.  Prognosis: days- <2 weeks mostly likely  Disposition:  They want to take her home as soon as possible with hospice care- [facility].  [facility] [phone] 70 minutes Greater than 50%  of this time was spent counseling and coordinating care related to the above assessment and plan.

## 2017-06-10 NOTE — Progress Notes (Signed)
Patient ID: Samantha CiscoDolores Hasz, female   DOB: 10/10/1922, 84 y.o.   MRN: [id number]          [facility]    Date of Admission:  06/05/2017     I spoke with Dr. Phillips OdorGolding this morning and agree with the transition to comfort measures only.  Her ceftriaxone has been stopped.  I will sign off now.         Cliffton AstersJohn Sakai Wolford, MD [facility] [phone] pager   [phone] cell 06/10/2017, 2:00 PM

## 2017-06-11 ENCOUNTER — Other Ambulatory Visit (HOSPITAL_COMMUNITY): Payer: Commercial Managed Care - HMO

## 2017-06-11 LAB — CULTURE, BLOOD (ROUTINE X 2)
Culture: NO GROWTH
Culture: NO GROWTH
SPECIAL REQUESTS: ADEQUATE
Special Requests: ADEQUATE

## 2017-06-11 SURGERY — ECHOCARDIOGRAM, TRANSESOPHAGEAL
Anesthesia: Moderate Sedation

## 2017-06-14 LAB — CULTURE, BLOOD (ROUTINE X 2)
CULTURE: NO GROWTH
Culture: NO GROWTH
Special Requests: ADEQUATE
Special Requests: ADEQUATE

## 2017-07-10 DEATH — deceased

## 2018-10-28 IMAGING — CT CT HEAD W/O CM
3 series · 15 of 47 positions shown, 18 images · non-contrast
Comparison: CT 09/03/2005

CLINICAL DATA: Altered level of consciousness.

EXAM:
CT HEAD WITHOUT CONTRAST
TECHNIQUE: Contiguous axial images were obtained from the base of the skull
through the vertex without intravenous contrast.

[Series 3: head 5.0 h30s · axial · 0.40mm/px · z∈[-109,+26]mm · 9 of 33 slices shown, 12 images]
[im 3/33  brain]
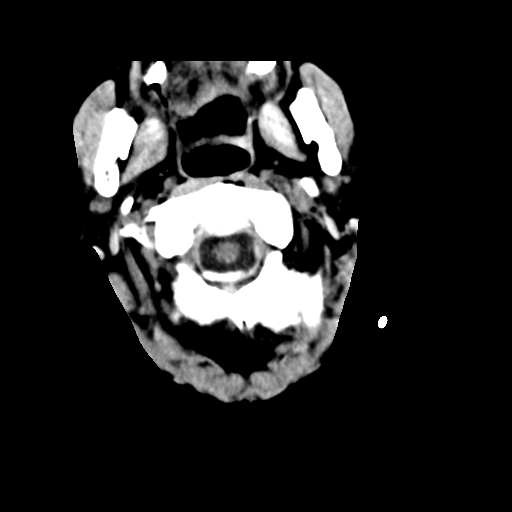
[im 3/33  bone]
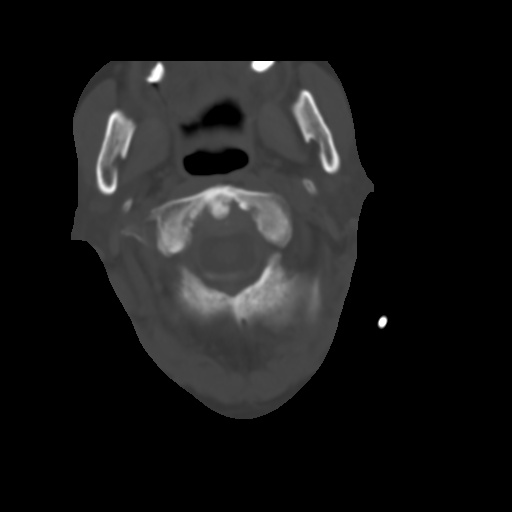
[im 6/33  brain]
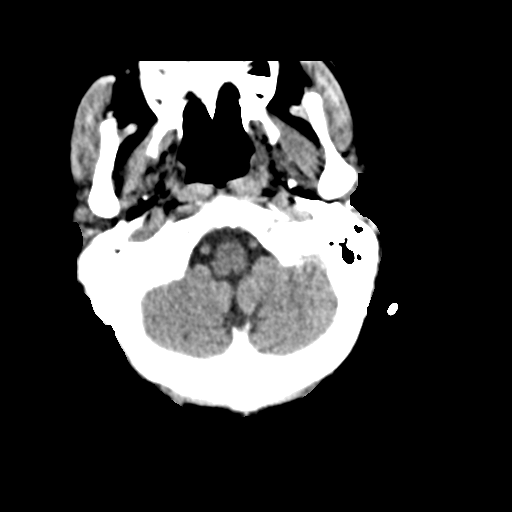
[im 9/33  brain]
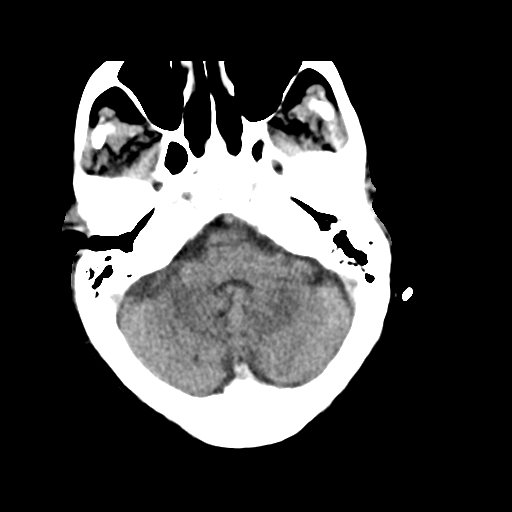
[im 13/33  brain]
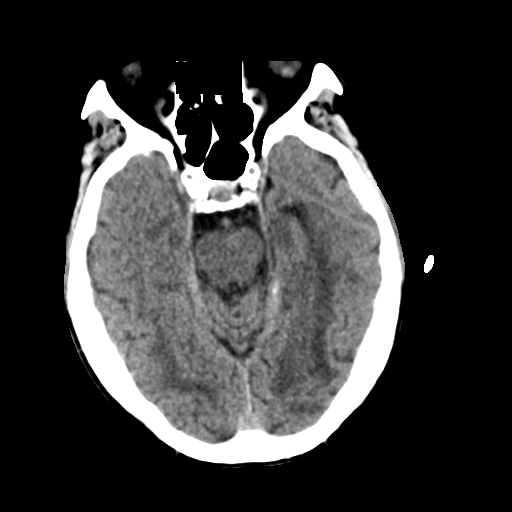
[im 17/33  brain]
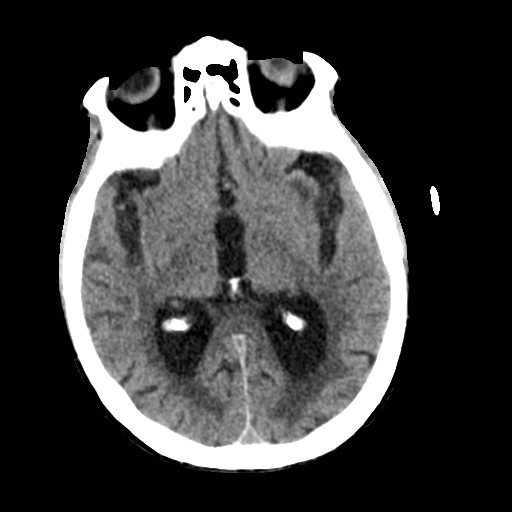
[im 17/33  bone]
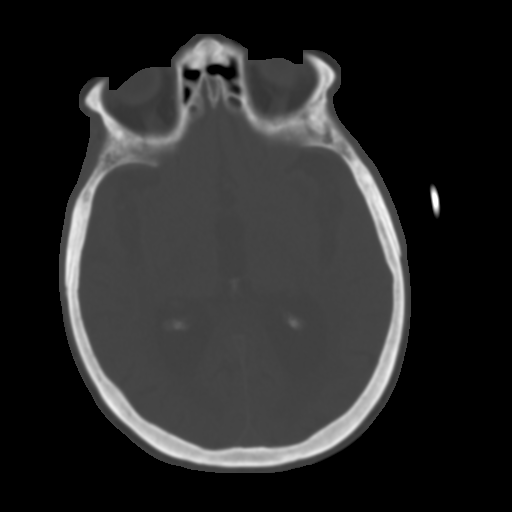
[im 20/33  brain]
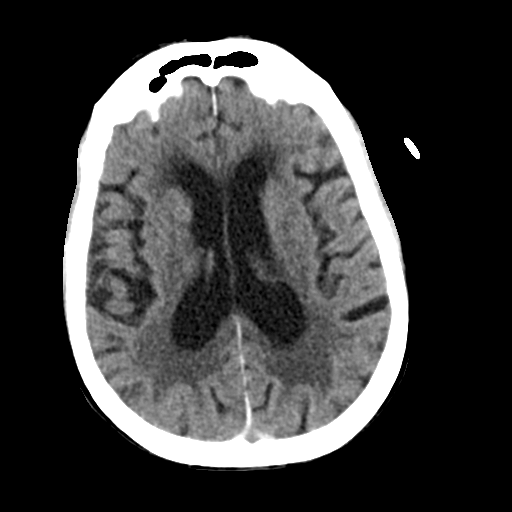
[im 24/33  brain]
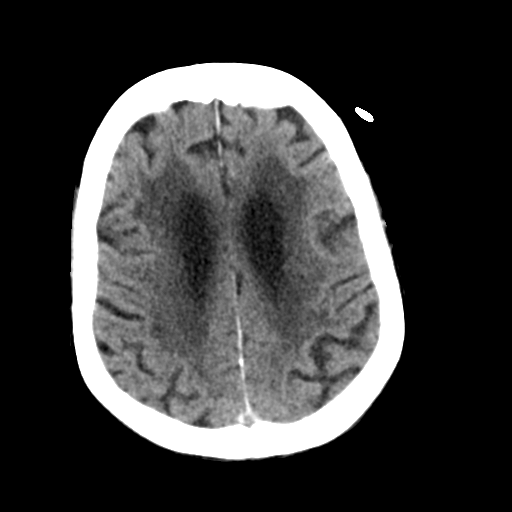
[im 27/33  brain]
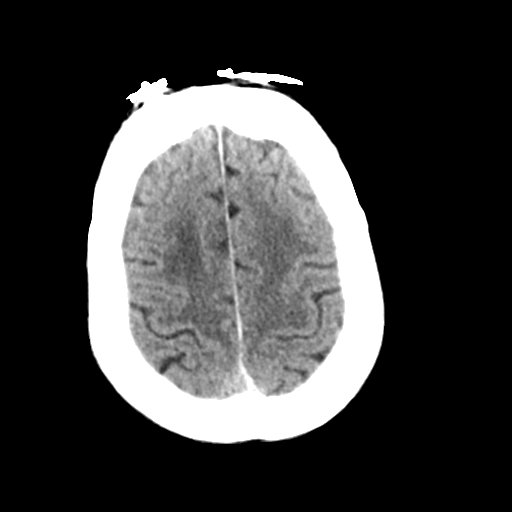
[im 30/33  brain]
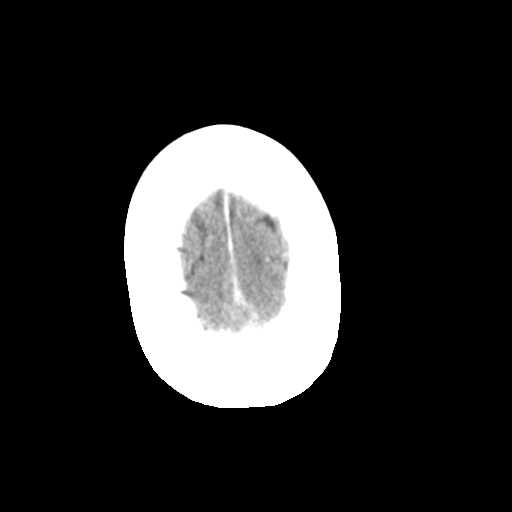
[im 30/33  bone]
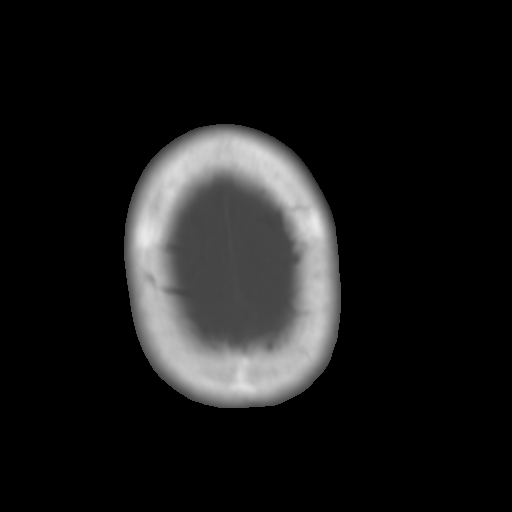

[Series 5: head 3.0 mpr cor · coronal · 0.32mm/px · 3 of 67 slices shown]
[im 23/67  brain]
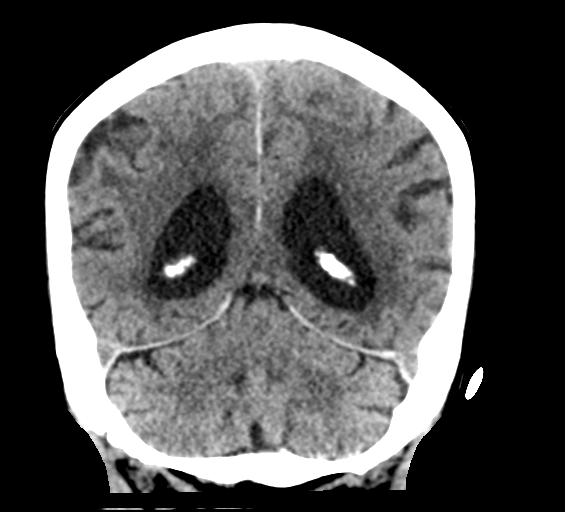
[im 30/67  brain]
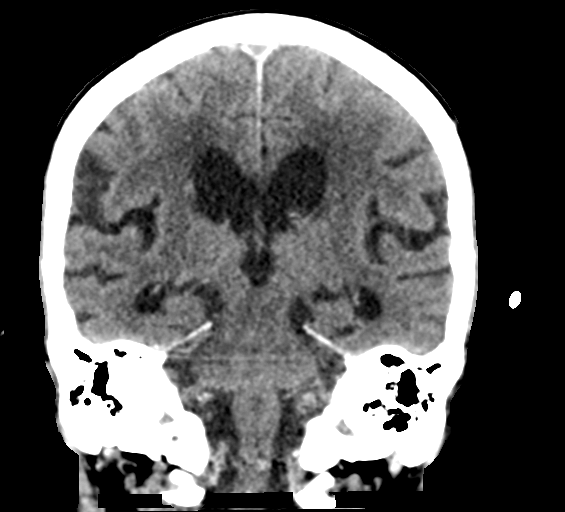
[im 37/67  brain]
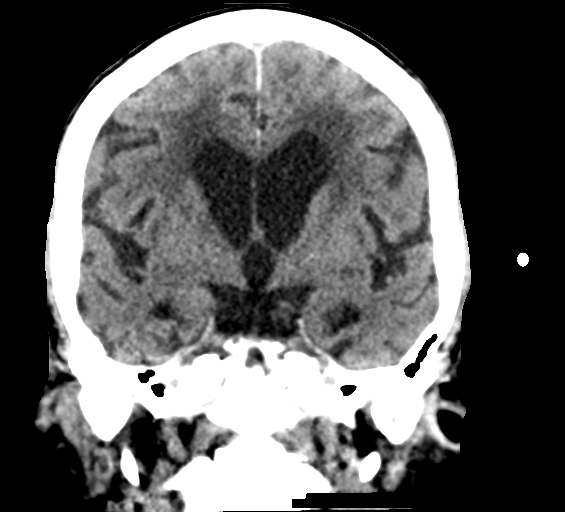

[Series 6: head 3.0 mpr sag · sagittal · 0.34mm/px · 3 of 63 slices shown]
[im 21/63  brain]
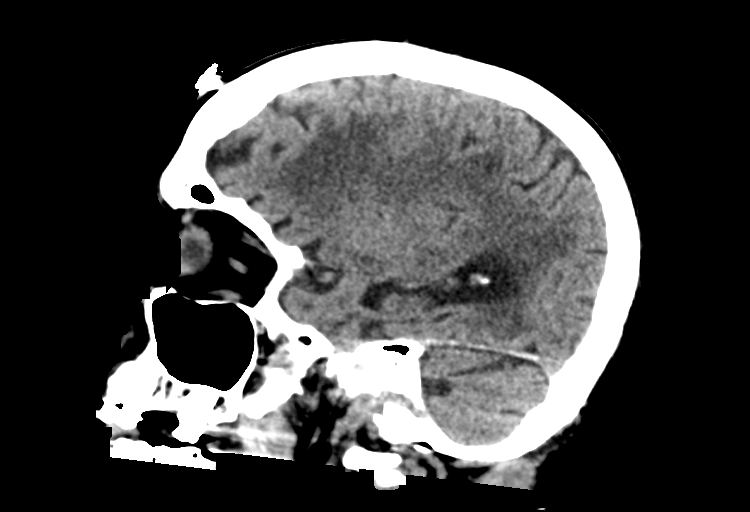
[im 32/63  brain]
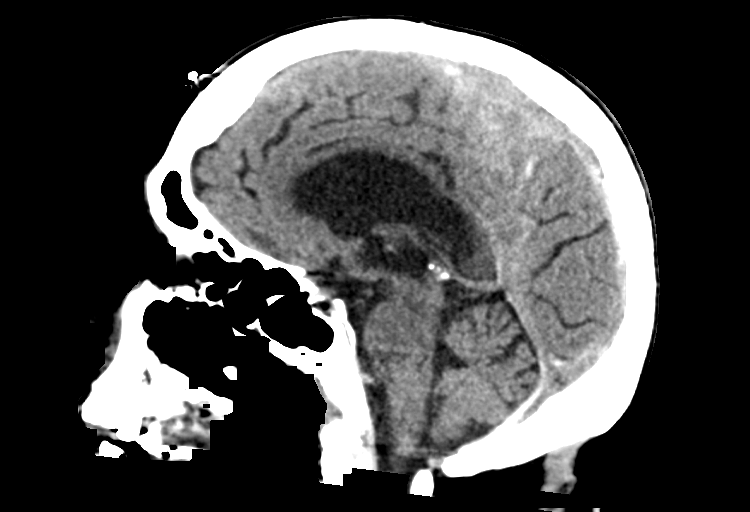
[im 42/63  brain]
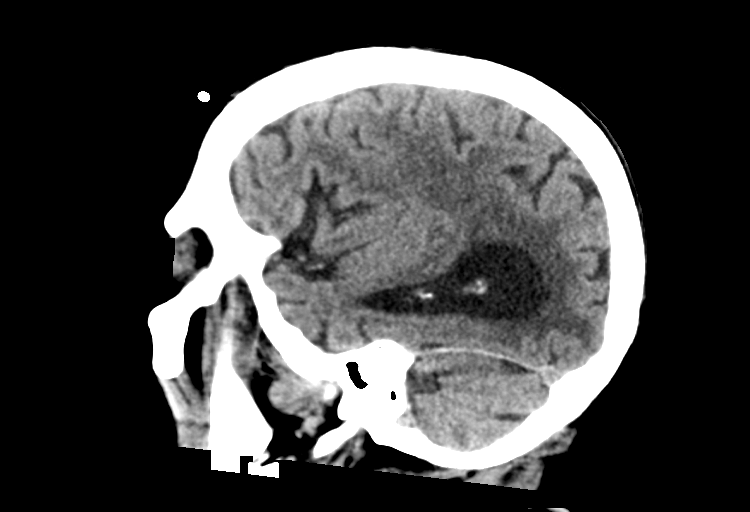

[15 of 47 positions shown; findings below may reference images not displayed]

FINDINGS: Brain: Marked progression of cerebral atrophy which is now moderate
to advanced. Extensive white matter disease has developed since the
prior study. This appears chronic.

Negative for acute infarct, hemorrhage, or mass lesion.

Vascular: Negative for hyperdense vessel

Skull: Negative

Sinuses/Orbits: Paranasal sinuses clear.  Bilateral lens replacement

Other: None
IMPRESSION: Marked progression of atrophy and chronic white matter ischemia. No
acute abnormality.
